# Patient Record
Sex: Male | Born: 1935
Health system: Southern US, Community
[De-identification: ages and names within clinical notes are randomized; demographics above are authoritative.]

## PROBLEM LIST (undated history)

## (undated) DIAGNOSIS — I2510 Atherosclerotic heart disease of native coronary artery without angina pectoris: Secondary | ICD-10-CM

## (undated) DIAGNOSIS — M1990 Unspecified osteoarthritis, unspecified site: Secondary | ICD-10-CM

## (undated) DIAGNOSIS — I4891 Unspecified atrial fibrillation: Secondary | ICD-10-CM

## (undated) DIAGNOSIS — I10 Essential (primary) hypertension: Secondary | ICD-10-CM

## (undated) DIAGNOSIS — M549 Dorsalgia, unspecified: Secondary | ICD-10-CM

## (undated) HISTORY — DX: Unspecified atrial fibrillation: I48.91

## (undated) HISTORY — DX: Dorsalgia, unspecified: M54.9

## (undated) HISTORY — DX: Unspecified osteoarthritis, unspecified site: M19.90

## (undated) HISTORY — DX: Atherosclerotic heart disease of native coronary artery without angina pectoris: I25.10

## (undated) HISTORY — DX: Essential (primary) hypertension: I10

---

## 2005-02-19 ENCOUNTER — Ambulatory Visit: Payer: Self-pay | Admitting: Internal Medicine

## 2005-07-09 ENCOUNTER — Ambulatory Visit: Payer: Self-pay | Admitting: Gastroenterology

## 2009-08-13 ENCOUNTER — Ambulatory Visit: Payer: Self-pay | Admitting: Internal Medicine

## 2009-08-13 ENCOUNTER — Inpatient Hospital Stay: Payer: Self-pay | Admitting: Internal Medicine

## 2011-09-29 ENCOUNTER — Ambulatory Visit: Payer: Self-pay | Admitting: Family

## 2014-02-07 DIAGNOSIS — I10 Essential (primary) hypertension: Secondary | ICD-10-CM | POA: Diagnosis not present

## 2014-02-07 DIAGNOSIS — Z7901 Long term (current) use of anticoagulants: Secondary | ICD-10-CM | POA: Diagnosis not present

## 2014-02-07 DIAGNOSIS — I208 Other forms of angina pectoris: Secondary | ICD-10-CM | POA: Diagnosis not present

## 2014-02-07 DIAGNOSIS — I4891 Unspecified atrial fibrillation: Secondary | ICD-10-CM | POA: Diagnosis not present

## 2014-03-10 DIAGNOSIS — I208 Other forms of angina pectoris: Secondary | ICD-10-CM | POA: Diagnosis not present

## 2014-03-10 DIAGNOSIS — I4891 Unspecified atrial fibrillation: Secondary | ICD-10-CM | POA: Diagnosis not present

## 2014-04-10 DIAGNOSIS — I208 Other forms of angina pectoris: Secondary | ICD-10-CM | POA: Diagnosis not present

## 2014-04-10 DIAGNOSIS — E784 Other hyperlipidemia: Secondary | ICD-10-CM | POA: Diagnosis not present

## 2014-04-10 DIAGNOSIS — I4891 Unspecified atrial fibrillation: Secondary | ICD-10-CM | POA: Diagnosis not present

## 2014-04-10 DIAGNOSIS — I10 Essential (primary) hypertension: Secondary | ICD-10-CM | POA: Diagnosis not present

## 2014-05-11 DIAGNOSIS — I10 Essential (primary) hypertension: Secondary | ICD-10-CM | POA: Diagnosis not present

## 2014-05-19 DIAGNOSIS — I119 Hypertensive heart disease without heart failure: Secondary | ICD-10-CM | POA: Diagnosis not present

## 2014-05-19 DIAGNOSIS — I4891 Unspecified atrial fibrillation: Secondary | ICD-10-CM | POA: Diagnosis not present

## 2014-05-19 DIAGNOSIS — I208 Other forms of angina pectoris: Secondary | ICD-10-CM | POA: Diagnosis not present

## 2014-05-19 DIAGNOSIS — I519 Heart disease, unspecified: Secondary | ICD-10-CM | POA: Diagnosis not present

## 2014-06-16 DIAGNOSIS — I10 Essential (primary) hypertension: Secondary | ICD-10-CM | POA: Diagnosis not present

## 2014-06-16 DIAGNOSIS — E784 Other hyperlipidemia: Secondary | ICD-10-CM | POA: Diagnosis not present

## 2014-06-16 DIAGNOSIS — I4891 Unspecified atrial fibrillation: Secondary | ICD-10-CM | POA: Diagnosis not present

## 2014-07-17 DIAGNOSIS — E784 Other hyperlipidemia: Secondary | ICD-10-CM | POA: Diagnosis not present

## 2014-07-17 DIAGNOSIS — I10 Essential (primary) hypertension: Secondary | ICD-10-CM | POA: Diagnosis not present

## 2014-07-17 DIAGNOSIS — I208 Other forms of angina pectoris: Secondary | ICD-10-CM | POA: Diagnosis not present

## 2014-07-17 DIAGNOSIS — I4891 Unspecified atrial fibrillation: Secondary | ICD-10-CM | POA: Diagnosis not present

## 2014-08-14 DIAGNOSIS — I10 Essential (primary) hypertension: Secondary | ICD-10-CM | POA: Diagnosis not present

## 2014-08-14 DIAGNOSIS — I208 Other forms of angina pectoris: Secondary | ICD-10-CM | POA: Diagnosis not present

## 2014-08-14 DIAGNOSIS — E784 Other hyperlipidemia: Secondary | ICD-10-CM | POA: Diagnosis not present

## 2014-08-14 DIAGNOSIS — I4891 Unspecified atrial fibrillation: Secondary | ICD-10-CM | POA: Diagnosis not present

## 2014-09-11 DIAGNOSIS — I4891 Unspecified atrial fibrillation: Secondary | ICD-10-CM | POA: Diagnosis not present

## 2014-09-11 DIAGNOSIS — I10 Essential (primary) hypertension: Secondary | ICD-10-CM | POA: Diagnosis not present

## 2014-09-28 DIAGNOSIS — Z125 Encounter for screening for malignant neoplasm of prostate: Secondary | ICD-10-CM | POA: Diagnosis not present

## 2014-09-28 DIAGNOSIS — I10 Essential (primary) hypertension: Secondary | ICD-10-CM | POA: Diagnosis not present

## 2014-09-28 DIAGNOSIS — R5381 Other malaise: Secondary | ICD-10-CM | POA: Diagnosis not present

## 2014-09-28 DIAGNOSIS — E784 Other hyperlipidemia: Secondary | ICD-10-CM | POA: Diagnosis not present

## 2014-10-02 DIAGNOSIS — R69 Illness, unspecified: Secondary | ICD-10-CM | POA: Diagnosis not present

## 2014-10-12 DIAGNOSIS — I208 Other forms of angina pectoris: Secondary | ICD-10-CM | POA: Diagnosis not present

## 2014-10-12 DIAGNOSIS — I10 Essential (primary) hypertension: Secondary | ICD-10-CM | POA: Diagnosis not present

## 2014-10-12 DIAGNOSIS — I4891 Unspecified atrial fibrillation: Secondary | ICD-10-CM | POA: Diagnosis not present

## 2014-11-13 DIAGNOSIS — I4891 Unspecified atrial fibrillation: Secondary | ICD-10-CM | POA: Diagnosis not present

## 2014-11-13 DIAGNOSIS — I208 Other forms of angina pectoris: Secondary | ICD-10-CM | POA: Diagnosis not present

## 2014-11-13 DIAGNOSIS — H532 Diplopia: Secondary | ICD-10-CM | POA: Diagnosis not present

## 2014-11-14 DIAGNOSIS — H5210 Myopia, unspecified eye: Secondary | ICD-10-CM | POA: Diagnosis not present

## 2014-11-14 DIAGNOSIS — H524 Presbyopia: Secondary | ICD-10-CM | POA: Diagnosis not present

## 2014-11-14 DIAGNOSIS — Z961 Presence of intraocular lens: Secondary | ICD-10-CM | POA: Diagnosis not present

## 2014-11-20 ENCOUNTER — Other Ambulatory Visit: Payer: Self-pay | Admitting: Internal Medicine

## 2014-11-20 DIAGNOSIS — I6340 Cerebral infarction due to embolism of unspecified cerebral artery: Secondary | ICD-10-CM

## 2014-11-20 DIAGNOSIS — I208 Other forms of angina pectoris: Secondary | ICD-10-CM | POA: Diagnosis not present

## 2014-11-30 ENCOUNTER — Other Ambulatory Visit: Payer: Self-pay | Admitting: Internal Medicine

## 2014-11-30 ENCOUNTER — Ambulatory Visit
Admission: RE | Admit: 2014-11-30 | Discharge: 2014-11-30 | Disposition: A | Discharge status: Home / Self Care | Payer: Commercial Managed Care - HMO | Source: Ambulatory Visit | Attending: Internal Medicine | Admitting: Internal Medicine

## 2014-11-30 DIAGNOSIS — I6340 Cerebral infarction due to embolism of unspecified cerebral artery: Secondary | ICD-10-CM | POA: Insufficient documentation

## 2014-11-30 DIAGNOSIS — J323 Chronic sphenoidal sinusitis: Secondary | ICD-10-CM | POA: Insufficient documentation

## 2014-11-30 DIAGNOSIS — G9389 Other specified disorders of brain: Secondary | ICD-10-CM | POA: Insufficient documentation

## 2014-11-30 DIAGNOSIS — H538 Other visual disturbances: Secondary | ICD-10-CM | POA: Diagnosis not present

## 2014-11-30 MED ORDER — GADOBENATE DIMEGLUMINE 529 MG/ML IV SOLN
15.0000 mL | Freq: Once | INTRAVENOUS | Status: AC | PRN
  Administered 2014-11-30: 15 mL via INTRAVENOUS

## 2014-12-19 DIAGNOSIS — I4891 Unspecified atrial fibrillation: Secondary | ICD-10-CM | POA: Diagnosis not present

## 2014-12-19 DIAGNOSIS — Q179 Congenital malformation of ear, unspecified: Secondary | ICD-10-CM | POA: Diagnosis not present

## 2015-01-09 DIAGNOSIS — L570 Actinic keratosis: Secondary | ICD-10-CM | POA: Diagnosis not present

## 2015-01-09 DIAGNOSIS — L578 Other skin changes due to chronic exposure to nonionizing radiation: Secondary | ICD-10-CM | POA: Diagnosis not present

## 2015-01-09 DIAGNOSIS — C44212 Basal cell carcinoma of skin of right ear and external auricular canal: Secondary | ICD-10-CM | POA: Diagnosis not present

## 2015-01-09 DIAGNOSIS — Z85828 Personal history of other malignant neoplasm of skin: Secondary | ICD-10-CM | POA: Diagnosis not present

## 2015-01-09 DIAGNOSIS — D485 Neoplasm of uncertain behavior of skin: Secondary | ICD-10-CM | POA: Diagnosis not present

## 2015-01-18 DIAGNOSIS — I10 Essential (primary) hypertension: Secondary | ICD-10-CM | POA: Diagnosis not present

## 2015-01-18 DIAGNOSIS — I208 Other forms of angina pectoris: Secondary | ICD-10-CM | POA: Diagnosis not present

## 2015-01-18 DIAGNOSIS — I4891 Unspecified atrial fibrillation: Secondary | ICD-10-CM | POA: Diagnosis not present

## 2015-01-18 DIAGNOSIS — E784 Other hyperlipidemia: Secondary | ICD-10-CM | POA: Diagnosis not present

## 2015-02-13 DIAGNOSIS — C44212 Basal cell carcinoma of skin of right ear and external auricular canal: Secondary | ICD-10-CM | POA: Diagnosis not present

## 2015-02-19 DIAGNOSIS — I10 Essential (primary) hypertension: Secondary | ICD-10-CM | POA: Diagnosis not present

## 2015-02-19 DIAGNOSIS — I208 Other forms of angina pectoris: Secondary | ICD-10-CM | POA: Diagnosis not present

## 2015-02-19 DIAGNOSIS — I4891 Unspecified atrial fibrillation: Secondary | ICD-10-CM | POA: Diagnosis not present

## 2015-02-19 DIAGNOSIS — E784 Other hyperlipidemia: Secondary | ICD-10-CM | POA: Diagnosis not present

## 2015-03-05 DIAGNOSIS — I4891 Unspecified atrial fibrillation: Secondary | ICD-10-CM | POA: Diagnosis not present

## 2015-03-05 DIAGNOSIS — I208 Other forms of angina pectoris: Secondary | ICD-10-CM | POA: Diagnosis not present

## 2015-03-05 DIAGNOSIS — E784 Other hyperlipidemia: Secondary | ICD-10-CM | POA: Diagnosis not present

## 2015-03-28 DIAGNOSIS — L578 Other skin changes due to chronic exposure to nonionizing radiation: Secondary | ICD-10-CM | POA: Diagnosis not present

## 2015-03-28 DIAGNOSIS — L570 Actinic keratosis: Secondary | ICD-10-CM | POA: Diagnosis not present

## 2015-03-28 DIAGNOSIS — Z85828 Personal history of other malignant neoplasm of skin: Secondary | ICD-10-CM | POA: Diagnosis not present

## 2015-04-02 DIAGNOSIS — I4891 Unspecified atrial fibrillation: Secondary | ICD-10-CM | POA: Diagnosis not present

## 2015-04-02 DIAGNOSIS — E784 Other hyperlipidemia: Secondary | ICD-10-CM | POA: Diagnosis not present

## 2015-04-02 DIAGNOSIS — I10 Essential (primary) hypertension: Secondary | ICD-10-CM | POA: Diagnosis not present

## 2015-05-03 DIAGNOSIS — I4891 Unspecified atrial fibrillation: Secondary | ICD-10-CM | POA: Diagnosis not present

## 2015-05-03 DIAGNOSIS — I208 Other forms of angina pectoris: Secondary | ICD-10-CM | POA: Diagnosis not present

## 2015-05-03 DIAGNOSIS — I10 Essential (primary) hypertension: Secondary | ICD-10-CM | POA: Diagnosis not present

## 2015-05-24 DIAGNOSIS — D485 Neoplasm of uncertain behavior of skin: Secondary | ICD-10-CM | POA: Diagnosis not present

## 2015-05-24 DIAGNOSIS — C44519 Basal cell carcinoma of skin of other part of trunk: Secondary | ICD-10-CM | POA: Diagnosis not present

## 2015-05-24 DIAGNOSIS — D1800 Hemangioma unspecified site: Secondary | ICD-10-CM | POA: Diagnosis not present

## 2015-05-24 DIAGNOSIS — L821 Other seborrheic keratosis: Secondary | ICD-10-CM | POA: Diagnosis not present

## 2015-05-24 DIAGNOSIS — Z85828 Personal history of other malignant neoplasm of skin: Secondary | ICD-10-CM | POA: Diagnosis not present

## 2015-05-24 DIAGNOSIS — L570 Actinic keratosis: Secondary | ICD-10-CM | POA: Diagnosis not present

## 2015-06-01 DIAGNOSIS — I10 Essential (primary) hypertension: Secondary | ICD-10-CM | POA: Diagnosis not present

## 2015-06-01 DIAGNOSIS — E784 Other hyperlipidemia: Secondary | ICD-10-CM | POA: Diagnosis not present

## 2015-06-01 DIAGNOSIS — I4891 Unspecified atrial fibrillation: Secondary | ICD-10-CM | POA: Diagnosis not present

## 2015-07-02 DIAGNOSIS — I208 Other forms of angina pectoris: Secondary | ICD-10-CM | POA: Diagnosis not present

## 2015-07-02 DIAGNOSIS — E784 Other hyperlipidemia: Secondary | ICD-10-CM | POA: Diagnosis not present

## 2015-07-02 DIAGNOSIS — I4891 Unspecified atrial fibrillation: Secondary | ICD-10-CM | POA: Diagnosis not present

## 2015-07-02 DIAGNOSIS — I10 Essential (primary) hypertension: Secondary | ICD-10-CM | POA: Diagnosis not present

## 2015-07-09 ENCOUNTER — Other Ambulatory Visit: Payer: Self-pay | Admitting: Internal Medicine

## 2015-07-09 ENCOUNTER — Ambulatory Visit
Admission: RE | Admit: 2015-07-09 | Discharge: 2015-07-09 | Disposition: A | Discharge status: Home / Self Care | Payer: Commercial Managed Care - HMO | Source: Ambulatory Visit | Attending: Internal Medicine | Admitting: Internal Medicine

## 2015-07-09 DIAGNOSIS — I6782 Cerebral ischemia: Secondary | ICD-10-CM | POA: Insufficient documentation

## 2015-07-09 DIAGNOSIS — J323 Chronic sphenoidal sinusitis: Secondary | ICD-10-CM | POA: Insufficient documentation

## 2015-07-09 DIAGNOSIS — R55 Syncope and collapse: Secondary | ICD-10-CM | POA: Diagnosis not present

## 2015-07-09 DIAGNOSIS — R5381 Other malaise: Secondary | ICD-10-CM | POA: Diagnosis not present

## 2015-07-09 DIAGNOSIS — E784 Other hyperlipidemia: Secondary | ICD-10-CM | POA: Diagnosis not present

## 2015-07-09 DIAGNOSIS — T671XXD Heat syncope, subsequent encounter: Secondary | ICD-10-CM

## 2015-07-09 DIAGNOSIS — I4891 Unspecified atrial fibrillation: Secondary | ICD-10-CM | POA: Diagnosis not present

## 2015-07-09 DIAGNOSIS — G9389 Other specified disorders of brain: Secondary | ICD-10-CM | POA: Insufficient documentation

## 2015-07-09 DIAGNOSIS — I10 Essential (primary) hypertension: Secondary | ICD-10-CM | POA: Diagnosis not present

## 2015-07-10 DIAGNOSIS — I4891 Unspecified atrial fibrillation: Secondary | ICD-10-CM | POA: Diagnosis not present

## 2015-07-10 DIAGNOSIS — I208 Other forms of angina pectoris: Secondary | ICD-10-CM | POA: Diagnosis not present

## 2015-07-10 DIAGNOSIS — I10 Essential (primary) hypertension: Secondary | ICD-10-CM | POA: Diagnosis not present

## 2015-07-10 DIAGNOSIS — E784 Other hyperlipidemia: Secondary | ICD-10-CM | POA: Diagnosis not present

## 2015-08-01 DIAGNOSIS — I4891 Unspecified atrial fibrillation: Secondary | ICD-10-CM | POA: Diagnosis not present

## 2015-08-01 DIAGNOSIS — I208 Other forms of angina pectoris: Secondary | ICD-10-CM | POA: Diagnosis not present

## 2015-08-01 DIAGNOSIS — I10 Essential (primary) hypertension: Secondary | ICD-10-CM | POA: Diagnosis not present

## 2015-08-01 DIAGNOSIS — E784 Other hyperlipidemia: Secondary | ICD-10-CM | POA: Diagnosis not present

## 2015-08-29 DIAGNOSIS — I4891 Unspecified atrial fibrillation: Secondary | ICD-10-CM | POA: Diagnosis not present

## 2015-08-29 DIAGNOSIS — E784 Other hyperlipidemia: Secondary | ICD-10-CM | POA: Diagnosis not present

## 2015-08-29 DIAGNOSIS — I208 Other forms of angina pectoris: Secondary | ICD-10-CM | POA: Diagnosis not present

## 2015-08-29 DIAGNOSIS — I10 Essential (primary) hypertension: Secondary | ICD-10-CM | POA: Diagnosis not present

## 2015-09-27 DIAGNOSIS — I208 Other forms of angina pectoris: Secondary | ICD-10-CM | POA: Diagnosis not present

## 2015-09-27 DIAGNOSIS — I4891 Unspecified atrial fibrillation: Secondary | ICD-10-CM | POA: Diagnosis not present

## 2015-09-27 DIAGNOSIS — E784 Other hyperlipidemia: Secondary | ICD-10-CM | POA: Diagnosis not present

## 2015-10-08 DIAGNOSIS — I208 Other forms of angina pectoris: Secondary | ICD-10-CM | POA: Diagnosis not present

## 2015-10-08 DIAGNOSIS — I4891 Unspecified atrial fibrillation: Secondary | ICD-10-CM | POA: Diagnosis not present

## 2015-10-08 DIAGNOSIS — R079 Chest pain, unspecified: Secondary | ICD-10-CM | POA: Diagnosis not present

## 2015-10-09 DIAGNOSIS — I4891 Unspecified atrial fibrillation: Secondary | ICD-10-CM | POA: Diagnosis not present

## 2015-10-09 DIAGNOSIS — R079 Chest pain, unspecified: Secondary | ICD-10-CM | POA: Diagnosis not present

## 2015-10-09 DIAGNOSIS — I208 Other forms of angina pectoris: Secondary | ICD-10-CM | POA: Diagnosis not present

## 2015-11-01 DIAGNOSIS — I208 Other forms of angina pectoris: Secondary | ICD-10-CM | POA: Diagnosis not present

## 2015-11-01 DIAGNOSIS — I10 Essential (primary) hypertension: Secondary | ICD-10-CM | POA: Diagnosis not present

## 2015-11-01 DIAGNOSIS — I4891 Unspecified atrial fibrillation: Secondary | ICD-10-CM | POA: Diagnosis not present

## 2015-11-01 DIAGNOSIS — Z23 Encounter for immunization: Secondary | ICD-10-CM | POA: Diagnosis not present

## 2015-11-01 DIAGNOSIS — E784 Other hyperlipidemia: Secondary | ICD-10-CM | POA: Diagnosis not present

## 2015-11-30 DIAGNOSIS — I208 Other forms of angina pectoris: Secondary | ICD-10-CM | POA: Diagnosis not present

## 2015-11-30 DIAGNOSIS — I10 Essential (primary) hypertension: Secondary | ICD-10-CM | POA: Diagnosis not present

## 2015-11-30 DIAGNOSIS — I4891 Unspecified atrial fibrillation: Secondary | ICD-10-CM | POA: Diagnosis not present

## 2015-11-30 DIAGNOSIS — E784 Other hyperlipidemia: Secondary | ICD-10-CM | POA: Diagnosis not present

## 2015-12-28 DIAGNOSIS — I208 Other forms of angina pectoris: Secondary | ICD-10-CM | POA: Diagnosis not present

## 2015-12-28 DIAGNOSIS — I4891 Unspecified atrial fibrillation: Secondary | ICD-10-CM | POA: Diagnosis not present

## 2015-12-28 DIAGNOSIS — E784 Other hyperlipidemia: Secondary | ICD-10-CM | POA: Diagnosis not present

## 2015-12-28 DIAGNOSIS — I10 Essential (primary) hypertension: Secondary | ICD-10-CM | POA: Diagnosis not present

## 2016-01-28 DIAGNOSIS — E784 Other hyperlipidemia: Secondary | ICD-10-CM | POA: Diagnosis not present

## 2016-01-28 DIAGNOSIS — J208 Acute bronchitis due to other specified organisms: Secondary | ICD-10-CM | POA: Diagnosis not present

## 2016-01-28 DIAGNOSIS — I10 Essential (primary) hypertension: Secondary | ICD-10-CM | POA: Diagnosis not present

## 2016-01-28 DIAGNOSIS — I4891 Unspecified atrial fibrillation: Secondary | ICD-10-CM | POA: Diagnosis not present

## 2016-02-26 DIAGNOSIS — I4891 Unspecified atrial fibrillation: Secondary | ICD-10-CM | POA: Diagnosis not present

## 2016-02-26 DIAGNOSIS — I208 Other forms of angina pectoris: Secondary | ICD-10-CM | POA: Diagnosis not present

## 2016-02-26 DIAGNOSIS — I10 Essential (primary) hypertension: Secondary | ICD-10-CM | POA: Diagnosis not present

## 2016-02-26 DIAGNOSIS — E784 Other hyperlipidemia: Secondary | ICD-10-CM | POA: Diagnosis not present

## 2016-03-25 DIAGNOSIS — I208 Other forms of angina pectoris: Secondary | ICD-10-CM | POA: Diagnosis not present

## 2016-03-25 DIAGNOSIS — E784 Other hyperlipidemia: Secondary | ICD-10-CM | POA: Diagnosis not present

## 2016-03-25 DIAGNOSIS — I4891 Unspecified atrial fibrillation: Secondary | ICD-10-CM | POA: Diagnosis not present

## 2016-03-25 DIAGNOSIS — I10 Essential (primary) hypertension: Secondary | ICD-10-CM | POA: Diagnosis not present

## 2016-04-22 DIAGNOSIS — I208 Other forms of angina pectoris: Secondary | ICD-10-CM | POA: Diagnosis not present

## 2016-04-22 DIAGNOSIS — I4891 Unspecified atrial fibrillation: Secondary | ICD-10-CM | POA: Diagnosis not present

## 2016-04-22 DIAGNOSIS — I10 Essential (primary) hypertension: Secondary | ICD-10-CM | POA: Diagnosis not present

## 2016-04-22 DIAGNOSIS — E784 Other hyperlipidemia: Secondary | ICD-10-CM | POA: Diagnosis not present

## 2016-05-23 DIAGNOSIS — I4891 Unspecified atrial fibrillation: Secondary | ICD-10-CM | POA: Diagnosis not present

## 2016-05-23 DIAGNOSIS — I10 Essential (primary) hypertension: Secondary | ICD-10-CM | POA: Diagnosis not present

## 2016-05-23 DIAGNOSIS — E784 Other hyperlipidemia: Secondary | ICD-10-CM | POA: Diagnosis not present

## 2016-05-23 DIAGNOSIS — I208 Other forms of angina pectoris: Secondary | ICD-10-CM | POA: Diagnosis not present

## 2016-06-20 DIAGNOSIS — I208 Other forms of angina pectoris: Secondary | ICD-10-CM | POA: Diagnosis not present

## 2016-06-20 DIAGNOSIS — Z125 Encounter for screening for malignant neoplasm of prostate: Secondary | ICD-10-CM | POA: Diagnosis not present

## 2016-06-20 DIAGNOSIS — I4891 Unspecified atrial fibrillation: Secondary | ICD-10-CM | POA: Diagnosis not present

## 2016-06-20 DIAGNOSIS — R5381 Other malaise: Secondary | ICD-10-CM | POA: Diagnosis not present

## 2016-06-20 DIAGNOSIS — I10 Essential (primary) hypertension: Secondary | ICD-10-CM | POA: Diagnosis not present

## 2016-06-20 DIAGNOSIS — E784 Other hyperlipidemia: Secondary | ICD-10-CM | POA: Diagnosis not present

## 2016-07-28 DIAGNOSIS — I4891 Unspecified atrial fibrillation: Secondary | ICD-10-CM | POA: Diagnosis not present

## 2016-07-28 DIAGNOSIS — I208 Other forms of angina pectoris: Secondary | ICD-10-CM | POA: Diagnosis not present

## 2016-07-28 DIAGNOSIS — E784 Other hyperlipidemia: Secondary | ICD-10-CM | POA: Diagnosis not present

## 2016-07-28 DIAGNOSIS — I10 Essential (primary) hypertension: Secondary | ICD-10-CM | POA: Diagnosis not present

## 2016-08-25 DIAGNOSIS — I10 Essential (primary) hypertension: Secondary | ICD-10-CM | POA: Diagnosis not present

## 2016-08-25 DIAGNOSIS — I208 Other forms of angina pectoris: Secondary | ICD-10-CM | POA: Diagnosis not present

## 2016-08-25 DIAGNOSIS — I4891 Unspecified atrial fibrillation: Secondary | ICD-10-CM | POA: Diagnosis not present

## 2016-08-25 DIAGNOSIS — E784 Other hyperlipidemia: Secondary | ICD-10-CM | POA: Diagnosis not present

## 2016-08-26 IMAGING — CT CT HEAD W/O CM
3 series · 15 of 47 positions shown, 18 images · non-contrast
Comparison: MRI from 11/30/2014.

CLINICAL DATA: Syncope.

EXAM:
CT HEAD WITHOUT CONTRAST
TECHNIQUE: Contiguous axial images were obtained from the base of the skull
through the vertex without intravenous contrast.

[Series 2: head wo · axial · 0.42mm/px · z∈[-176,-51]mm · 9 of 31 slices shown, 12 images]
[im 3/31  brain]
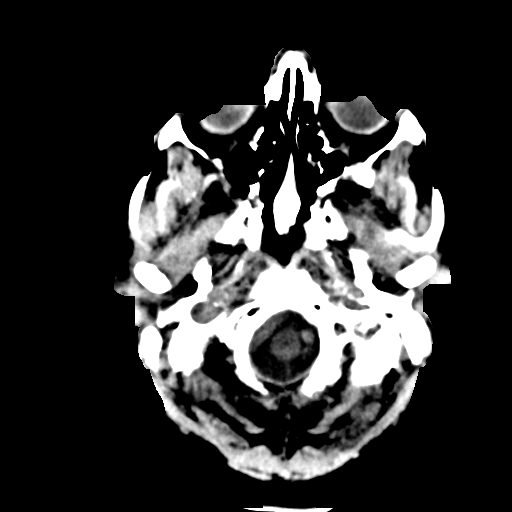
[im 3/31  bone]
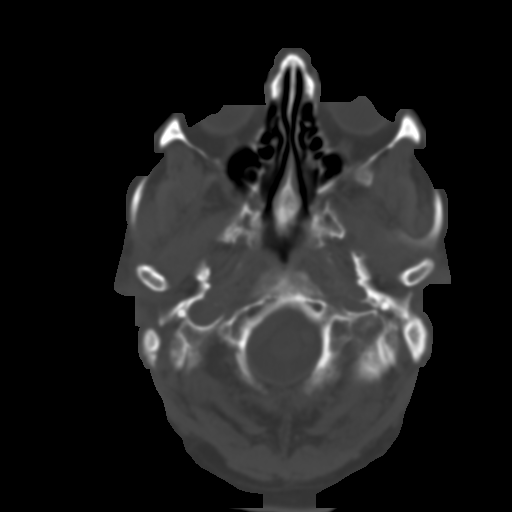
[im 6/31  brain]
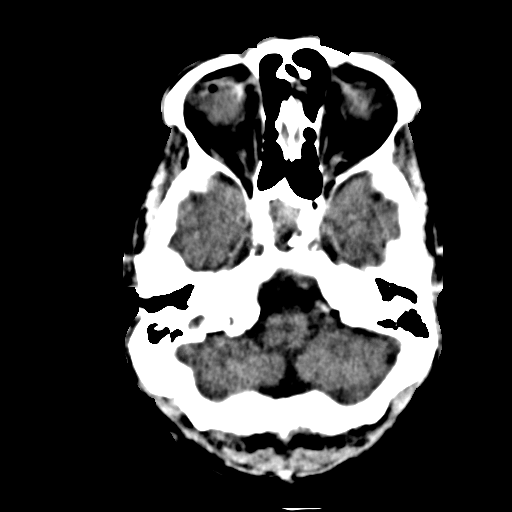
[im 9/31  brain]
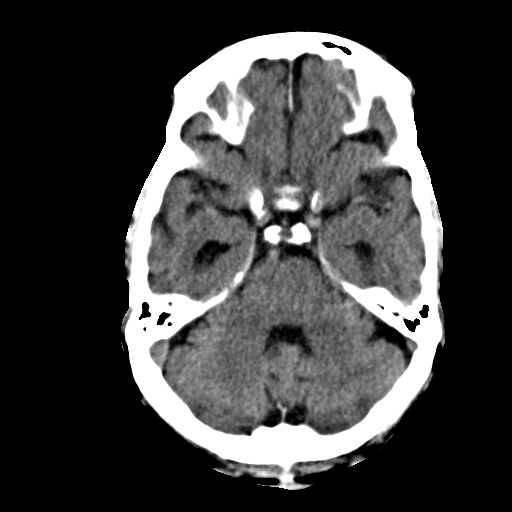
[im 12/31  brain]
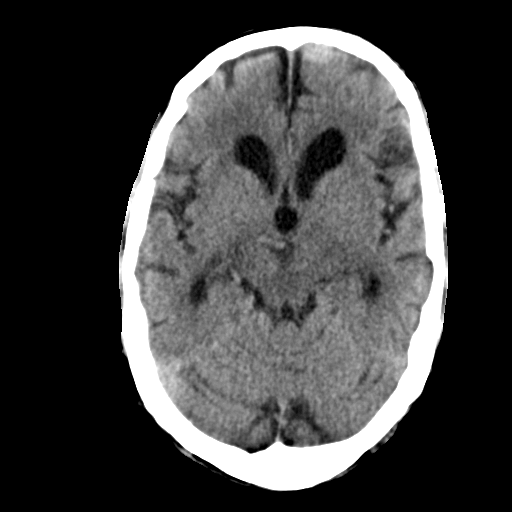
[im 16/31  brain]
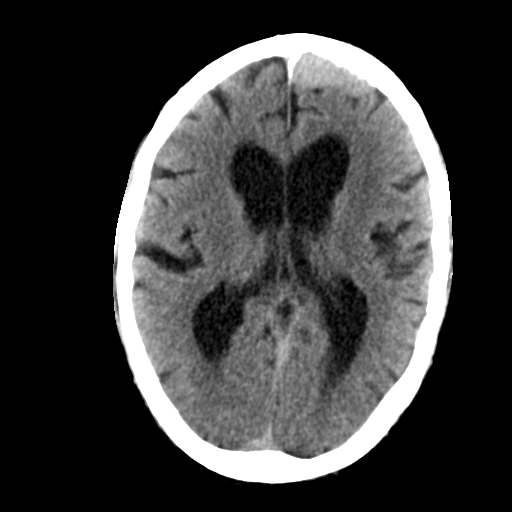
[im 16/31  bone]
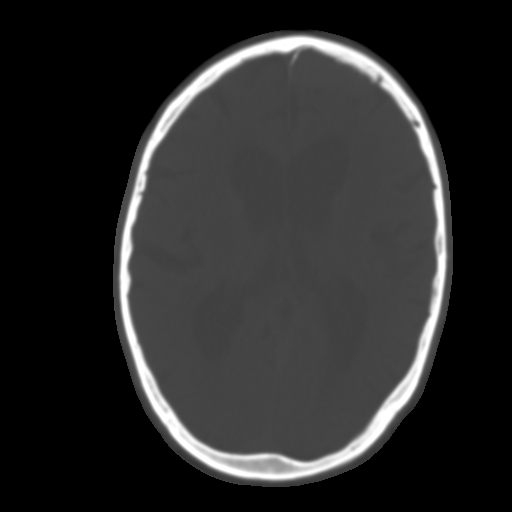
[im 19/31  brain]
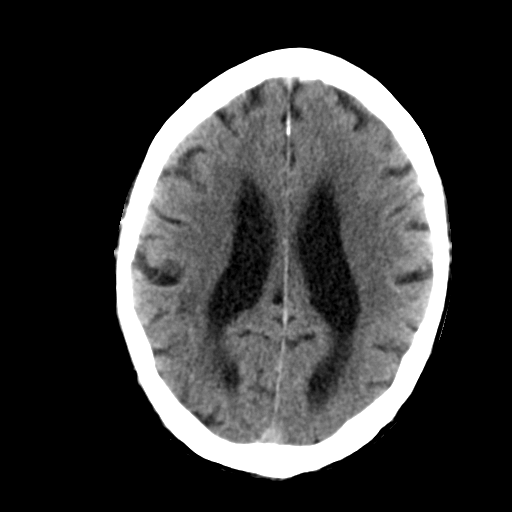
[im 22/31  brain]
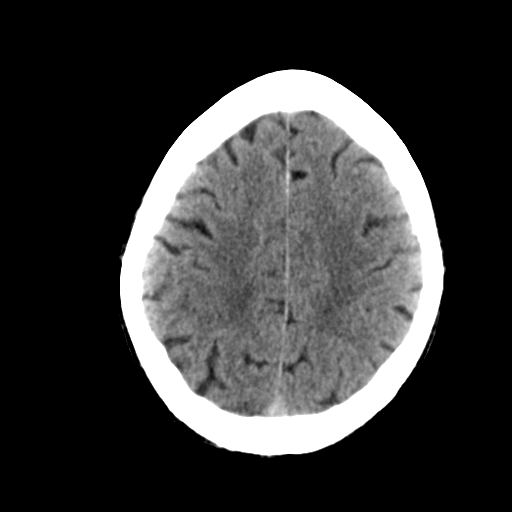
[im 25/31  brain]
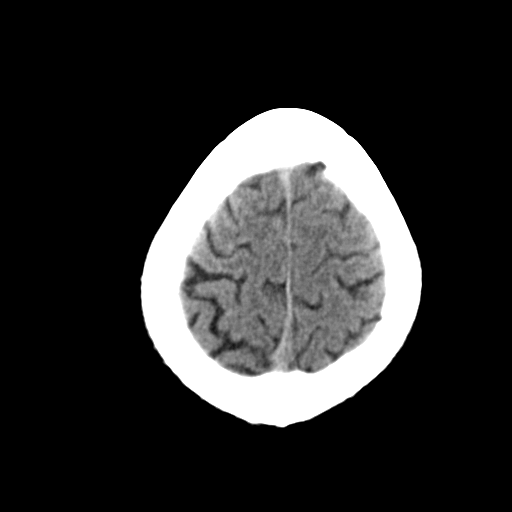
[im 28/31  brain]
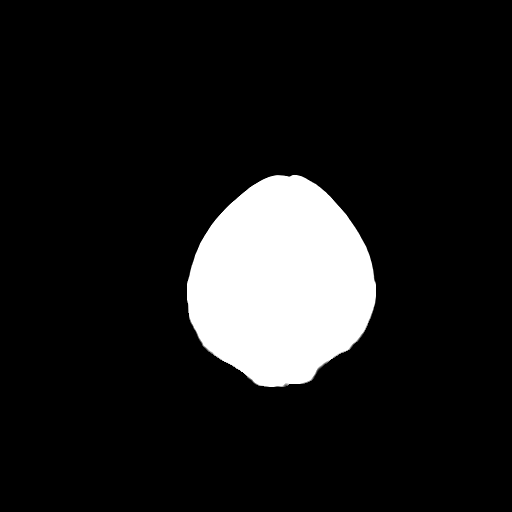
[im 28/31  bone]
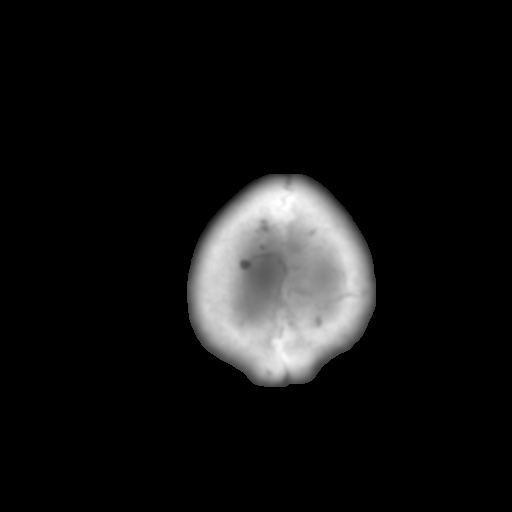

[Series 4: coronal soft · coronal · 0.32mm/px · 3 of 68 slices shown]
[im 23/68  brain]
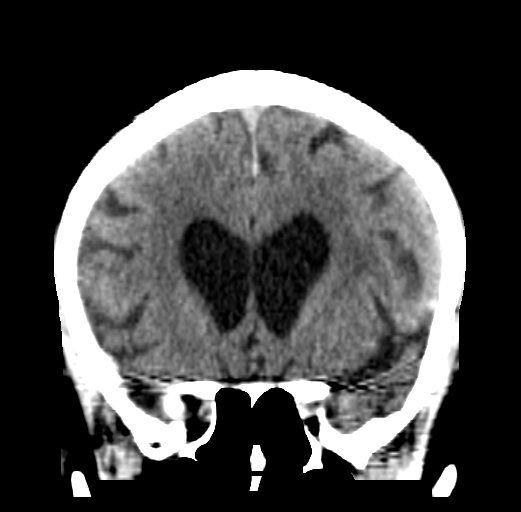
[im 30/68  brain]
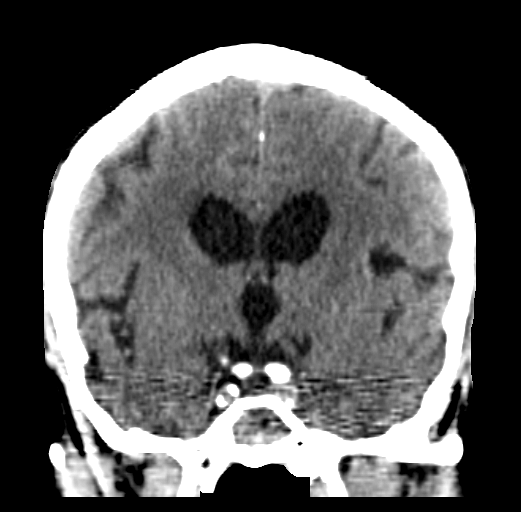
[im 38/68  brain]
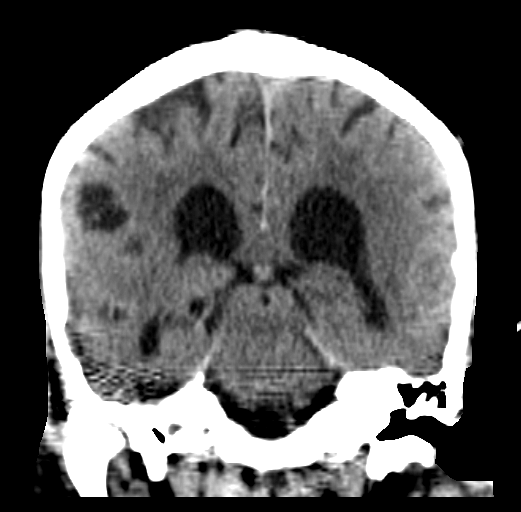

[Series 5: sagittal soft · sagittal · 0.31mm/px · 3 of 53 slices shown]
[im 18/53  brain]
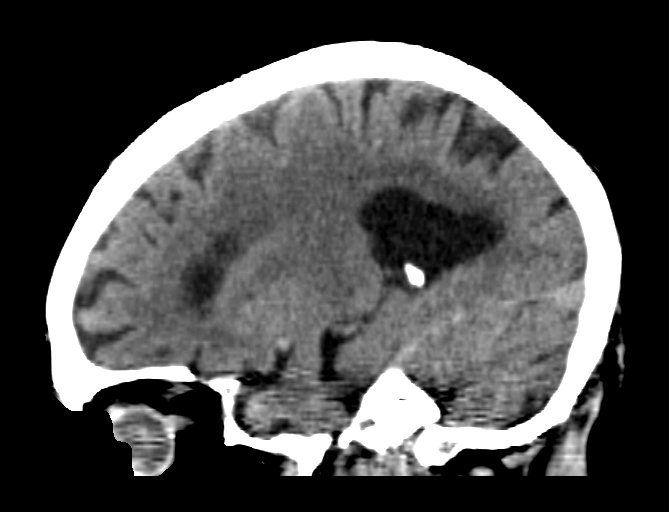
[im 27/53  brain]
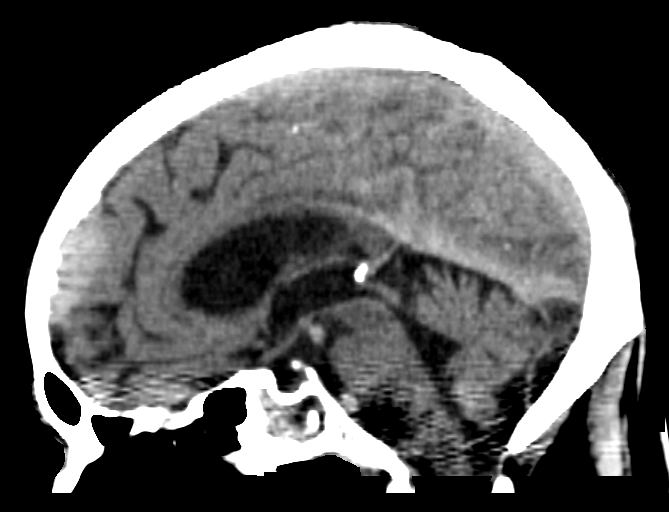
[im 35/53  brain]
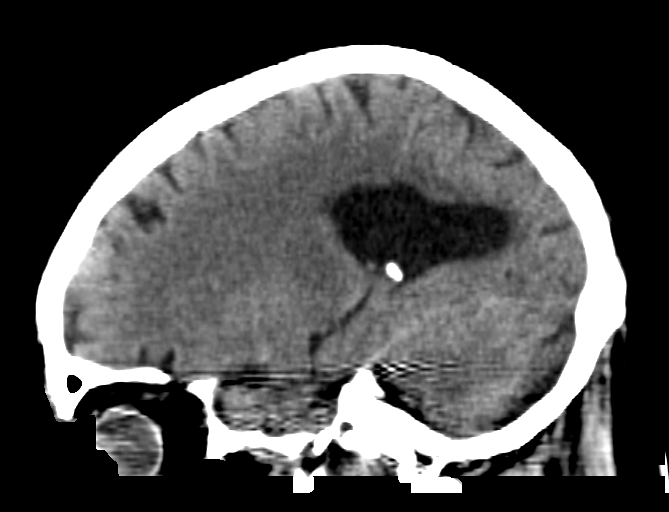

[15 of 47 positions shown; findings below may reference images not displayed]

FINDINGS: 3.4 x 1.5 cm left frontal extra-axial mass lesion is again
identified, not substantially changed in the interval since the
prior MRI. No evidence for acute hemorrhage, hydrocephalus, or
midline shift. No CT evidence for acute infarction. Diffuse loss of
parenchymal volume is consistent with atrophy. Patchy low
attenuation in the deep hemispheric and periventricular white matter
is nonspecific, but likely reflects chronic microvascular ischemic
demyelination.

Chronic opacification of the right sphenoid sinus again noted. The
remaining visualized paranasal sinuses and mastoid air cells are
clear. No evidence for skull fracture.
IMPRESSION: 1. No new or acute interval findings.
2. 3.4 x 1.5 cm left frontal extra-axial mass lesion better
characterized on previous MRI as likely related to meningioma or
intracranial dermoid. No evidence for associated edema on today's
study.
3. Stable chronic opacification of the right sphenoid sinus.
4. Atrophy with chronic small vessel white matter ischemic disease.

## 2016-09-26 DIAGNOSIS — I208 Other forms of angina pectoris: Secondary | ICD-10-CM | POA: Diagnosis not present

## 2016-09-26 DIAGNOSIS — I10 Essential (primary) hypertension: Secondary | ICD-10-CM | POA: Diagnosis not present

## 2016-09-26 DIAGNOSIS — I4891 Unspecified atrial fibrillation: Secondary | ICD-10-CM | POA: Diagnosis not present

## 2016-09-26 DIAGNOSIS — E784 Other hyperlipidemia: Secondary | ICD-10-CM | POA: Diagnosis not present

## 2016-12-15 DIAGNOSIS — Z961 Presence of intraocular lens: Secondary | ICD-10-CM | POA: Diagnosis not present

## 2016-12-15 DIAGNOSIS — H524 Presbyopia: Secondary | ICD-10-CM | POA: Diagnosis not present

## 2017-02-05 DIAGNOSIS — I4891 Unspecified atrial fibrillation: Secondary | ICD-10-CM | POA: Diagnosis not present

## 2017-02-05 DIAGNOSIS — R21 Rash and other nonspecific skin eruption: Secondary | ICD-10-CM | POA: Diagnosis not present

## 2017-02-05 DIAGNOSIS — I10 Essential (primary) hypertension: Secondary | ICD-10-CM | POA: Diagnosis not present

## 2017-02-05 DIAGNOSIS — I208 Other forms of angina pectoris: Secondary | ICD-10-CM | POA: Diagnosis not present

## 2017-03-09 DIAGNOSIS — I208 Other forms of angina pectoris: Secondary | ICD-10-CM | POA: Diagnosis not present

## 2017-03-09 DIAGNOSIS — E785 Hyperlipidemia, unspecified: Secondary | ICD-10-CM | POA: Diagnosis not present

## 2017-03-09 DIAGNOSIS — I4891 Unspecified atrial fibrillation: Secondary | ICD-10-CM | POA: Diagnosis not present

## 2017-03-09 DIAGNOSIS — I10 Essential (primary) hypertension: Secondary | ICD-10-CM | POA: Diagnosis not present

## 2017-03-17 DIAGNOSIS — I4891 Unspecified atrial fibrillation: Secondary | ICD-10-CM | POA: Diagnosis not present

## 2017-03-17 DIAGNOSIS — E785 Hyperlipidemia, unspecified: Secondary | ICD-10-CM | POA: Diagnosis not present

## 2017-03-17 DIAGNOSIS — R0789 Other chest pain: Secondary | ICD-10-CM | POA: Diagnosis not present

## 2017-03-17 DIAGNOSIS — I10 Essential (primary) hypertension: Secondary | ICD-10-CM | POA: Diagnosis not present

## 2017-04-14 DIAGNOSIS — I208 Other forms of angina pectoris: Secondary | ICD-10-CM | POA: Diagnosis not present

## 2017-04-14 DIAGNOSIS — I10 Essential (primary) hypertension: Secondary | ICD-10-CM | POA: Diagnosis not present

## 2017-04-14 DIAGNOSIS — I4891 Unspecified atrial fibrillation: Secondary | ICD-10-CM | POA: Diagnosis not present

## 2017-04-14 DIAGNOSIS — E785 Hyperlipidemia, unspecified: Secondary | ICD-10-CM | POA: Diagnosis not present

## 2017-05-11 DIAGNOSIS — E785 Hyperlipidemia, unspecified: Secondary | ICD-10-CM | POA: Diagnosis not present

## 2017-05-11 DIAGNOSIS — I10 Essential (primary) hypertension: Secondary | ICD-10-CM | POA: Diagnosis not present

## 2017-05-11 DIAGNOSIS — I208 Other forms of angina pectoris: Secondary | ICD-10-CM | POA: Diagnosis not present

## 2017-05-11 DIAGNOSIS — I4891 Unspecified atrial fibrillation: Secondary | ICD-10-CM | POA: Diagnosis not present

## 2017-05-11 DIAGNOSIS — R5381 Other malaise: Secondary | ICD-10-CM | POA: Diagnosis not present

## 2017-05-11 DIAGNOSIS — R0789 Other chest pain: Secondary | ICD-10-CM | POA: Diagnosis not present

## 2017-06-08 DIAGNOSIS — R0789 Other chest pain: Secondary | ICD-10-CM | POA: Diagnosis not present

## 2017-06-08 DIAGNOSIS — E785 Hyperlipidemia, unspecified: Secondary | ICD-10-CM | POA: Diagnosis not present

## 2017-06-08 DIAGNOSIS — I4891 Unspecified atrial fibrillation: Secondary | ICD-10-CM | POA: Diagnosis not present

## 2017-06-08 DIAGNOSIS — I10 Essential (primary) hypertension: Secondary | ICD-10-CM | POA: Diagnosis not present

## 2017-06-08 DIAGNOSIS — I208 Other forms of angina pectoris: Secondary | ICD-10-CM | POA: Diagnosis not present

## 2017-07-09 DIAGNOSIS — I208 Other forms of angina pectoris: Secondary | ICD-10-CM | POA: Diagnosis not present

## 2017-07-09 DIAGNOSIS — I4891 Unspecified atrial fibrillation: Secondary | ICD-10-CM | POA: Diagnosis not present

## 2017-07-09 DIAGNOSIS — I10 Essential (primary) hypertension: Secondary | ICD-10-CM | POA: Diagnosis not present

## 2017-07-09 DIAGNOSIS — E785 Hyperlipidemia, unspecified: Secondary | ICD-10-CM | POA: Diagnosis not present

## 2017-08-07 DIAGNOSIS — E785 Hyperlipidemia, unspecified: Secondary | ICD-10-CM | POA: Diagnosis not present

## 2017-08-07 DIAGNOSIS — I10 Essential (primary) hypertension: Secondary | ICD-10-CM | POA: Diagnosis not present

## 2017-08-07 DIAGNOSIS — I4891 Unspecified atrial fibrillation: Secondary | ICD-10-CM | POA: Diagnosis not present

## 2017-08-07 DIAGNOSIS — I208 Other forms of angina pectoris: Secondary | ICD-10-CM | POA: Diagnosis not present

## 2017-09-07 DIAGNOSIS — I4891 Unspecified atrial fibrillation: Secondary | ICD-10-CM | POA: Diagnosis not present

## 2017-09-07 DIAGNOSIS — I10 Essential (primary) hypertension: Secondary | ICD-10-CM | POA: Diagnosis not present

## 2017-09-07 DIAGNOSIS — E785 Hyperlipidemia, unspecified: Secondary | ICD-10-CM | POA: Diagnosis not present

## 2017-09-07 DIAGNOSIS — I208 Other forms of angina pectoris: Secondary | ICD-10-CM | POA: Diagnosis not present

## 2017-10-08 DIAGNOSIS — N400 Enlarged prostate without lower urinary tract symptoms: Secondary | ICD-10-CM | POA: Diagnosis not present

## 2017-10-08 DIAGNOSIS — Z0000 Encounter for general adult medical examination without abnormal findings: Secondary | ICD-10-CM | POA: Diagnosis not present

## 2017-10-08 DIAGNOSIS — I350 Nonrheumatic aortic (valve) stenosis: Secondary | ICD-10-CM | POA: Diagnosis not present

## 2017-10-08 DIAGNOSIS — I119 Hypertensive heart disease without heart failure: Secondary | ICD-10-CM | POA: Diagnosis not present

## 2017-10-08 DIAGNOSIS — I4891 Unspecified atrial fibrillation: Secondary | ICD-10-CM | POA: Diagnosis not present

## 2017-10-12 DIAGNOSIS — I4891 Unspecified atrial fibrillation: Secondary | ICD-10-CM | POA: Diagnosis not present

## 2017-10-12 DIAGNOSIS — N400 Enlarged prostate without lower urinary tract symptoms: Secondary | ICD-10-CM | POA: Diagnosis not present

## 2017-10-12 DIAGNOSIS — N423 Unspecified dysplasia of prostate: Secondary | ICD-10-CM | POA: Diagnosis not present

## 2017-10-12 DIAGNOSIS — I350 Nonrheumatic aortic (valve) stenosis: Secondary | ICD-10-CM | POA: Diagnosis not present

## 2017-10-12 DIAGNOSIS — I119 Hypertensive heart disease without heart failure: Secondary | ICD-10-CM | POA: Diagnosis not present

## 2017-11-09 DIAGNOSIS — N400 Enlarged prostate without lower urinary tract symptoms: Secondary | ICD-10-CM | POA: Diagnosis not present

## 2017-11-09 DIAGNOSIS — I4891 Unspecified atrial fibrillation: Secondary | ICD-10-CM | POA: Diagnosis not present

## 2017-11-09 DIAGNOSIS — I10 Essential (primary) hypertension: Secondary | ICD-10-CM | POA: Diagnosis not present

## 2017-11-09 DIAGNOSIS — I509 Heart failure, unspecified: Secondary | ICD-10-CM | POA: Diagnosis not present

## 2017-11-09 DIAGNOSIS — Z23 Encounter for immunization: Secondary | ICD-10-CM | POA: Diagnosis not present

## 2017-11-13 ENCOUNTER — Ambulatory Visit (INDEPENDENT_AMBULATORY_CARE_PROVIDER_SITE_OTHER): Payer: Medicare HMO | Admitting: Urology

## 2017-11-13 ENCOUNTER — Encounter: Payer: Self-pay | Admitting: Urology

## 2017-11-13 VITALS — BP 135/75 | HR 59

## 2017-11-13 DIAGNOSIS — N3943 Post-void dribbling: Secondary | ICD-10-CM

## 2017-11-13 DIAGNOSIS — N401 Enlarged prostate with lower urinary tract symptoms: Secondary | ICD-10-CM

## 2017-11-13 DIAGNOSIS — N402 Nodular prostate without lower urinary tract symptoms: Secondary | ICD-10-CM | POA: Diagnosis not present

## 2017-11-13 DIAGNOSIS — N400 Enlarged prostate without lower urinary tract symptoms: Secondary | ICD-10-CM

## 2017-11-13 LAB — BLADDER SCAN AMB NON-IMAGING

## 2017-11-13 NOTE — Progress Notes (Signed)
11/13/2017 10:55 AM   Jonathon Sutton Dec 05, 1926 [id number]  Referring provider: Cletis Athens, MD [address] [place], [place] [address]  Chief Complaint  Patient presents with  . Benign Prostatic Hypertrophy    New Patient    HPI: 84 year old male referred for further evaluation of BPH/abnormal rectal exam.  Interviewing the patient's primary care notes, Dr. Lavera Guise, the patient had a rectal exam as part of her routine visit on 10/08/2017.  His prostate was described as nontender and nodular, nodule at the right lobe.  As result of this abnormal rectal exam, he is referred for further urologic evaluation.  Patient did have a PSA on 06/2016 with a value of 8.6.  Patient does not understand why he is been referred to urology.  He reports that today that he has few voiding symptoms.  He occasionally has some post void dribbling and occasionally has difficulty starting a stream but ultimately is able to void.  No straining with urination.  He gets up one time at night to void.  No urgency or frequency.  No dysuria or gross hematuria.  He is overall satisfied with his voiding.  He is not currently taking any BPH meds.  He denies any weight loss.  No bone pain but does have arthritis which causes occasional aches and pains.    He does have multiple medical comorbidities including extensive cardiac history, CKD, hypertension, DVT, h/o stroke.    PVR minimal.    PMH: Past Medical History:  Diagnosis Date  . Arthritis   . Atrial fibrillation ([place])   . Back pain   . CAD (coronary artery disease)   . Hypertension     Surgical History: History reviewed. No pertinent surgical history.  Home Medications:  Allergies as of 11/13/2017   No Known Allergies     Medication List        Accurate as of 11/13/17 10:55 AM. Always use your most recent med list.          atenolol 25 MG tablet Commonly known as:  TENORMIN   isosorbide mononitrate 20 MG tablet Commonly known as:   ISMO,MONOKET   loratadine 10 MG tablet Commonly known as:  CLARITIN   lovastatin 20 MG tablet Commonly known as:  MEVACOR   triamterene-hydrochlorothiazide 37.5-25 MG tablet Commonly known as:  MAXZIDE-25   warfarin 2.5 MG tablet Commonly known as:  COUMADIN       Allergies: No Known Allergies  Family History: History reviewed. No pertinent family history.  Social History:  reports that he has never smoked. He has never used smokeless tobacco. He reports that he does not drink alcohol or use drugs.  ROS: UROLOGY Frequent Urination?: Yes Hard to postpone urination?: Yes Burning/pain with urination?: No Get up at night to urinate?: Yes Leakage of urine?: Yes Urine stream starts and stops?: Yes Trouble starting stream?: No Do you have to strain to urinate?: No Blood in urine?: No Urinary tract infection?: No Sexually transmitted disease?: No Injury to kidneys or bladder?: No Painful intercourse?: No Weak stream?: Yes Erection problems?: No Penile pain?: No  Gastrointestinal Nausea?: No Vomiting?: No Indigestion/heartburn?: No Diarrhea?: No Constipation?: No  Constitutional Fever: No Night sweats?: No Weight loss?: No Fatigue?: No  Skin Skin rash/lesions?: Yes Itching?: Yes  Eyes Blurred vision?: No Double vision?: No  Ears/Nose/Throat Sore throat?: No Sinus problems?: No  Hematologic/Lymphatic Swollen glands?: No Easy bruising?: No  Cardiovascular Leg swelling?: No Chest pain?: No  Respiratory Cough?: Yes Shortness of breath?:  No  Endocrine Excessive thirst?: No  Musculoskeletal Back pain?: Yes Joint pain?: Yes  Neurological Headaches?: No Dizziness?: No  Psychologic Depression?: No Anxiety?: No  Physical Exam: BP 135/75   Pulse (!) 59   Constitutional:  Alert and oriented, No acute distress.  Elderly, frail. HEENT: [place] AT, moist mucus membranes.  Trachea midline, no masses. Cardiovascular: No clubbing, cyanosis, or  edema. Respiratory: Normal respiratory effort, no increased work of breathing. GI: Abdomen is soft, nontender, nondistended, no abdominal masses Skin: No rashes, bruises or suspicious lesions. Neurologic: Grossly intact, no focal deficits, moving all 4 extremities. Psychiatric: Normal mood and affect.  Laboratory Data: NA  Urinalysis N/a  Pertinent Imaging: NA  Assessment & Plan:    1. Prostate nodule We had a lengthy discussion today about the natural history of prostate cancer and guidelines for PSA screening including PSA/DRE.  Given that he is 84 years old with multiple medical comorbidities, his overall life expectancy is most certainly less than 10 years.  Additionally, we generally not recommend screening after the age about 9-75 depending on the patient's medical comorbidities.  He does have a mildly elevated PSA with a nodular prostate exam per his primary care physician.  We discussed today that even if he does have prostate cancer, this will likely not affect his overall quality of life and result in morbidity or mortality.  As such, I have not recommended any further diagnostic evaluation or treatment.  The patient is understandable.  He does have some mild urinary symptoms but is overall not bothered.  If his urinary symptoms progressed, he was advised to return and will reevaluate.  2. Benign prostatic hyperplasia with post-void dribbling Minimal urinary symptoms Adequate emptying No indication for further treatment - BLADDER SCAN AMB NON-IMAGING  Return if symptoms worsen or fail to improve.  Hollice Espy, MD  [facility] [address], [place], [place] [address] [phone]

## 2017-12-10 DIAGNOSIS — I10 Essential (primary) hypertension: Secondary | ICD-10-CM | POA: Diagnosis not present

## 2017-12-10 DIAGNOSIS — I4891 Unspecified atrial fibrillation: Secondary | ICD-10-CM | POA: Diagnosis not present

## 2017-12-10 DIAGNOSIS — R5381 Other malaise: Secondary | ICD-10-CM | POA: Diagnosis not present

## 2017-12-10 DIAGNOSIS — N400 Enlarged prostate without lower urinary tract symptoms: Secondary | ICD-10-CM | POA: Diagnosis not present

## 2017-12-10 DIAGNOSIS — I509 Heart failure, unspecified: Secondary | ICD-10-CM | POA: Diagnosis not present

## 2018-01-21 DIAGNOSIS — I509 Heart failure, unspecified: Secondary | ICD-10-CM | POA: Diagnosis not present

## 2018-01-21 DIAGNOSIS — I5033 Acute on chronic diastolic (congestive) heart failure: Secondary | ICD-10-CM | POA: Diagnosis not present

## 2018-01-21 DIAGNOSIS — I350 Nonrheumatic aortic (valve) stenosis: Secondary | ICD-10-CM | POA: Diagnosis not present

## 2018-01-21 DIAGNOSIS — I4891 Unspecified atrial fibrillation: Secondary | ICD-10-CM | POA: Diagnosis not present

## 2018-02-16 DIAGNOSIS — I509 Heart failure, unspecified: Secondary | ICD-10-CM | POA: Diagnosis not present

## 2018-02-16 DIAGNOSIS — I4891 Unspecified atrial fibrillation: Secondary | ICD-10-CM | POA: Diagnosis not present

## 2018-02-16 DIAGNOSIS — I350 Nonrheumatic aortic (valve) stenosis: Secondary | ICD-10-CM | POA: Diagnosis not present

## 2018-02-16 DIAGNOSIS — I5033 Acute on chronic diastolic (congestive) heart failure: Secondary | ICD-10-CM | POA: Diagnosis not present

## 2018-02-22 DIAGNOSIS — I119 Hypertensive heart disease without heart failure: Secondary | ICD-10-CM | POA: Diagnosis not present

## 2018-02-22 DIAGNOSIS — I4891 Unspecified atrial fibrillation: Secondary | ICD-10-CM | POA: Diagnosis not present

## 2018-02-22 DIAGNOSIS — I208 Other forms of angina pectoris: Secondary | ICD-10-CM | POA: Diagnosis not present

## 2018-02-22 DIAGNOSIS — I10 Essential (primary) hypertension: Secondary | ICD-10-CM | POA: Diagnosis not present

## 2018-03-03 DIAGNOSIS — N400 Enlarged prostate without lower urinary tract symptoms: Secondary | ICD-10-CM | POA: Diagnosis not present

## 2018-03-03 DIAGNOSIS — I10 Essential (primary) hypertension: Secondary | ICD-10-CM | POA: Diagnosis not present

## 2018-03-03 DIAGNOSIS — I4891 Unspecified atrial fibrillation: Secondary | ICD-10-CM | POA: Diagnosis not present

## 2018-03-03 DIAGNOSIS — I208 Other forms of angina pectoris: Secondary | ICD-10-CM | POA: Diagnosis not present

## 2018-03-23 DIAGNOSIS — I119 Hypertensive heart disease without heart failure: Secondary | ICD-10-CM | POA: Diagnosis not present

## 2018-03-23 DIAGNOSIS — I4891 Unspecified atrial fibrillation: Secondary | ICD-10-CM | POA: Diagnosis not present

## 2018-03-23 DIAGNOSIS — I350 Nonrheumatic aortic (valve) stenosis: Secondary | ICD-10-CM | POA: Diagnosis not present

## 2018-04-23 DIAGNOSIS — I350 Nonrheumatic aortic (valve) stenosis: Secondary | ICD-10-CM | POA: Diagnosis not present

## 2018-04-23 DIAGNOSIS — I119 Hypertensive heart disease without heart failure: Secondary | ICD-10-CM | POA: Diagnosis not present

## 2018-04-23 DIAGNOSIS — I509 Heart failure, unspecified: Secondary | ICD-10-CM | POA: Diagnosis not present

## 2018-04-23 DIAGNOSIS — I4891 Unspecified atrial fibrillation: Secondary | ICD-10-CM | POA: Diagnosis not present

## 2018-05-24 DIAGNOSIS — N400 Enlarged prostate without lower urinary tract symptoms: Secondary | ICD-10-CM | POA: Diagnosis not present

## 2018-05-24 DIAGNOSIS — I4891 Unspecified atrial fibrillation: Secondary | ICD-10-CM | POA: Diagnosis not present

## 2018-05-24 DIAGNOSIS — I350 Nonrheumatic aortic (valve) stenosis: Secondary | ICD-10-CM | POA: Diagnosis not present

## 2018-05-24 DIAGNOSIS — I509 Heart failure, unspecified: Secondary | ICD-10-CM | POA: Diagnosis not present

## 2018-05-25 DIAGNOSIS — K625 Hemorrhage of anus and rectum: Secondary | ICD-10-CM | POA: Diagnosis not present

## 2018-05-25 DIAGNOSIS — I10 Essential (primary) hypertension: Secondary | ICD-10-CM | POA: Diagnosis not present

## 2018-05-25 DIAGNOSIS — Z125 Encounter for screening for malignant neoplasm of prostate: Secondary | ICD-10-CM | POA: Diagnosis not present

## 2018-05-25 DIAGNOSIS — R5381 Other malaise: Secondary | ICD-10-CM | POA: Diagnosis not present

## 2018-05-25 DIAGNOSIS — I4891 Unspecified atrial fibrillation: Secondary | ICD-10-CM | POA: Diagnosis not present

## 2018-06-01 DIAGNOSIS — N400 Enlarged prostate without lower urinary tract symptoms: Secondary | ICD-10-CM | POA: Diagnosis not present

## 2018-06-01 DIAGNOSIS — I350 Nonrheumatic aortic (valve) stenosis: Secondary | ICD-10-CM | POA: Diagnosis not present

## 2018-06-01 DIAGNOSIS — I4891 Unspecified atrial fibrillation: Secondary | ICD-10-CM | POA: Diagnosis not present

## 2018-06-01 DIAGNOSIS — I509 Heart failure, unspecified: Secondary | ICD-10-CM | POA: Diagnosis not present

## 2018-06-24 DIAGNOSIS — I350 Nonrheumatic aortic (valve) stenosis: Secondary | ICD-10-CM | POA: Diagnosis not present

## 2018-06-24 DIAGNOSIS — N400 Enlarged prostate without lower urinary tract symptoms: Secondary | ICD-10-CM | POA: Diagnosis not present

## 2018-06-24 DIAGNOSIS — I119 Hypertensive heart disease without heart failure: Secondary | ICD-10-CM | POA: Diagnosis not present

## 2018-06-24 DIAGNOSIS — I4891 Unspecified atrial fibrillation: Secondary | ICD-10-CM | POA: Diagnosis not present

## 2018-07-27 DIAGNOSIS — I5033 Acute on chronic diastolic (congestive) heart failure: Secondary | ICD-10-CM | POA: Diagnosis not present

## 2018-07-27 DIAGNOSIS — I4891 Unspecified atrial fibrillation: Secondary | ICD-10-CM | POA: Diagnosis not present

## 2018-07-27 DIAGNOSIS — I10 Essential (primary) hypertension: Secondary | ICD-10-CM | POA: Diagnosis not present

## 2018-07-27 DIAGNOSIS — I208 Other forms of angina pectoris: Secondary | ICD-10-CM | POA: Diagnosis not present

## 2018-08-30 DIAGNOSIS — W19XXXA Unspecified fall, initial encounter: Secondary | ICD-10-CM | POA: Diagnosis not present

## 2018-08-30 DIAGNOSIS — I509 Heart failure, unspecified: Secondary | ICD-10-CM | POA: Diagnosis not present

## 2018-08-30 DIAGNOSIS — I10 Essential (primary) hypertension: Secondary | ICD-10-CM | POA: Diagnosis not present

## 2018-08-30 DIAGNOSIS — I4891 Unspecified atrial fibrillation: Secondary | ICD-10-CM | POA: Diagnosis not present

## 2018-09-06 DIAGNOSIS — I4891 Unspecified atrial fibrillation: Secondary | ICD-10-CM | POA: Diagnosis not present

## 2018-09-06 DIAGNOSIS — I350 Nonrheumatic aortic (valve) stenosis: Secondary | ICD-10-CM | POA: Diagnosis not present

## 2018-09-06 DIAGNOSIS — Z23 Encounter for immunization: Secondary | ICD-10-CM | POA: Diagnosis not present

## 2018-09-06 DIAGNOSIS — I119 Hypertensive heart disease without heart failure: Secondary | ICD-10-CM | POA: Diagnosis not present

## 2018-10-06 DIAGNOSIS — W19XXXA Unspecified fall, initial encounter: Secondary | ICD-10-CM | POA: Diagnosis not present

## 2018-10-06 DIAGNOSIS — I509 Heart failure, unspecified: Secondary | ICD-10-CM | POA: Diagnosis not present

## 2018-10-06 DIAGNOSIS — I4891 Unspecified atrial fibrillation: Secondary | ICD-10-CM | POA: Diagnosis not present

## 2018-10-06 DIAGNOSIS — Y92009 Unspecified place in unspecified non-institutional (private) residence as the place of occurrence of the external cause: Secondary | ICD-10-CM | POA: Diagnosis not present

## 2018-11-10 DIAGNOSIS — I509 Heart failure, unspecified: Secondary | ICD-10-CM | POA: Diagnosis not present

## 2018-11-10 DIAGNOSIS — I119 Hypertensive heart disease without heart failure: Secondary | ICD-10-CM | POA: Diagnosis not present

## 2018-11-10 DIAGNOSIS — I350 Nonrheumatic aortic (valve) stenosis: Secondary | ICD-10-CM | POA: Diagnosis not present

## 2018-11-10 DIAGNOSIS — N400 Enlarged prostate without lower urinary tract symptoms: Secondary | ICD-10-CM | POA: Diagnosis not present

## 2018-11-15 DIAGNOSIS — I4891 Unspecified atrial fibrillation: Secondary | ICD-10-CM | POA: Diagnosis not present

## 2018-11-15 DIAGNOSIS — I119 Hypertensive heart disease without heart failure: Secondary | ICD-10-CM | POA: Diagnosis not present

## 2018-11-15 DIAGNOSIS — I350 Nonrheumatic aortic (valve) stenosis: Secondary | ICD-10-CM | POA: Diagnosis not present

## 2018-11-15 DIAGNOSIS — I509 Heart failure, unspecified: Secondary | ICD-10-CM | POA: Diagnosis not present

## 2018-11-15 DIAGNOSIS — N400 Enlarged prostate without lower urinary tract symptoms: Secondary | ICD-10-CM | POA: Diagnosis not present

## 2018-12-13 DIAGNOSIS — N400 Enlarged prostate without lower urinary tract symptoms: Secondary | ICD-10-CM | POA: Diagnosis not present

## 2018-12-13 DIAGNOSIS — I119 Hypertensive heart disease without heart failure: Secondary | ICD-10-CM | POA: Diagnosis not present

## 2018-12-13 DIAGNOSIS — I509 Heart failure, unspecified: Secondary | ICD-10-CM | POA: Diagnosis not present

## 2019-01-20 ENCOUNTER — Emergency Department: Payer: Medicare HMO

## 2019-01-20 ENCOUNTER — Other Ambulatory Visit: Payer: Self-pay

## 2019-01-20 ENCOUNTER — Emergency Department
Admission: EM | Admit: 2019-01-20 | Discharge: 2019-01-20 | Disposition: A | Discharge status: Home / Self Care | Payer: Medicare HMO | Attending: Emergency Medicine | Admitting: Emergency Medicine

## 2019-01-20 ENCOUNTER — Encounter: Payer: Self-pay | Admitting: Emergency Medicine

## 2019-01-20 DIAGNOSIS — R42 Dizziness and giddiness: Secondary | ICD-10-CM | POA: Diagnosis not present

## 2019-01-20 DIAGNOSIS — R531 Weakness: Secondary | ICD-10-CM | POA: Insufficient documentation

## 2019-01-20 DIAGNOSIS — R05 Cough: Secondary | ICD-10-CM | POA: Diagnosis not present

## 2019-01-20 DIAGNOSIS — Z5321 Procedure and treatment not carried out due to patient leaving prior to being seen by health care provider: Secondary | ICD-10-CM | POA: Diagnosis not present

## 2019-01-20 LAB — CBC
HCT: 42.7 % (ref 39.0–52.0)
Hemoglobin: 15.2 g/dL (ref 13.0–17.0)
MCH: 30.2 pg (ref 26.0–34.0)
MCHC: 35.6 g/dL (ref 30.0–36.0)
MCV: 84.7 fL (ref 80.0–100.0)
Platelets: 114 10*3/uL — ABNORMAL LOW (ref 150–400)
RBC: 5.04 MIL/uL (ref 4.22–5.81)
RDW: 13 % (ref 11.5–15.5)
WBC: 7.2 10*3/uL (ref 4.0–10.5)
nRBC: 0 % (ref 0.0–0.2)

## 2019-01-20 LAB — BASIC METABOLIC PANEL
Anion gap: 13 (ref 5–15)
BUN: 28 mg/dL — ABNORMAL HIGH (ref 8–23)
CO2: 23 mmol/L (ref 22–32)
Calcium: 8.8 mg/dL — ABNORMAL LOW (ref 8.9–10.3)
Chloride: 102 mmol/L (ref 98–111)
Creatinine, Ser: 1.3 mg/dL — ABNORMAL HIGH (ref 0.61–1.24)
GFR calc Af Amer: 56 mL/min — ABNORMAL LOW (ref >60)
GFR calc non Af Amer: 48 mL/min — ABNORMAL LOW (ref >60)
Glucose, Bld: 154 mg/dL — ABNORMAL HIGH (ref 70–99)
Potassium: 3.4 mmol/L — ABNORMAL LOW (ref 3.5–5.1)
Sodium: 138 mmol/L (ref 135–145)

## 2019-01-20 LAB — TROPONIN I (HIGH SENSITIVITY): Troponin I (High Sensitivity): 16 ng/L (ref <18)

## 2019-01-20 NOTE — ED Notes (Signed)
No answer when called several times from lobby; chart reviewed and protocol added

## 2019-01-20 NOTE — ED Notes (Signed)
No answer when called several times from lobby 

## 2019-01-20 NOTE — ED Triage Notes (Addendum)
Patient presents to the ED with increased weakness and dizziness as well as cough and congestion.  Patient is hard of hearing.  No obvious distress at this time.

## 2019-01-27 ENCOUNTER — Inpatient Hospital Stay
Admission: EM | Admit: 2019-01-27 | Discharge: 2019-02-08 | DRG: 177 | Disposition: A | Discharge status: Hospice | Payer: Medicare HMO | Attending: Internal Medicine | Admitting: Internal Medicine

## 2019-01-27 ENCOUNTER — Other Ambulatory Visit: Payer: Self-pay

## 2019-01-27 ENCOUNTER — Emergency Department: Payer: Medicare HMO

## 2019-01-27 DIAGNOSIS — I10 Essential (primary) hypertension: Secondary | ICD-10-CM | POA: Diagnosis present

## 2019-01-27 DIAGNOSIS — R058 Other specified cough: Secondary | ICD-10-CM | POA: Diagnosis present

## 2019-01-27 DIAGNOSIS — J9601 Acute respiratory failure with hypoxia: Secondary | ICD-10-CM | POA: Diagnosis present

## 2019-01-27 DIAGNOSIS — H9190 Unspecified hearing loss, unspecified ear: Secondary | ICD-10-CM | POA: Diagnosis present

## 2019-01-27 DIAGNOSIS — R627 Adult failure to thrive: Secondary | ICD-10-CM | POA: Diagnosis present

## 2019-01-27 DIAGNOSIS — R680 Hypothermia, not associated with low environmental temperature: Secondary | ICD-10-CM | POA: Diagnosis present

## 2019-01-27 DIAGNOSIS — N179 Acute kidney failure, unspecified: Secondary | ICD-10-CM | POA: Diagnosis present

## 2019-01-27 DIAGNOSIS — I482 Chronic atrial fibrillation, unspecified: Secondary | ICD-10-CM | POA: Diagnosis present

## 2019-01-27 DIAGNOSIS — I4891 Unspecified atrial fibrillation: Secondary | ICD-10-CM | POA: Diagnosis not present

## 2019-01-27 DIAGNOSIS — R451 Restlessness and agitation: Secondary | ICD-10-CM | POA: Diagnosis not present

## 2019-01-27 DIAGNOSIS — R0902 Hypoxemia: Secondary | ICD-10-CM | POA: Diagnosis not present

## 2019-01-27 DIAGNOSIS — R638 Other symptoms and signs concerning food and fluid intake: Secondary | ICD-10-CM | POA: Diagnosis not present

## 2019-01-27 DIAGNOSIS — Z6820 Body mass index (BMI) 20.0-20.9, adult: Secondary | ICD-10-CM

## 2019-01-27 DIAGNOSIS — Z7901 Long term (current) use of anticoagulants: Secondary | ICD-10-CM | POA: Diagnosis not present

## 2019-01-27 DIAGNOSIS — J1282 Pneumonia due to coronavirus disease 2019: Secondary | ICD-10-CM | POA: Diagnosis present

## 2019-01-27 DIAGNOSIS — I2510 Atherosclerotic heart disease of native coronary artery without angina pectoris: Secondary | ICD-10-CM | POA: Diagnosis present

## 2019-01-27 DIAGNOSIS — R6251 Failure to thrive (child): Secondary | ICD-10-CM

## 2019-01-27 DIAGNOSIS — I959 Hypotension, unspecified: Secondary | ICD-10-CM | POA: Diagnosis not present

## 2019-01-27 DIAGNOSIS — I4811 Longstanding persistent atrial fibrillation: Secondary | ICD-10-CM | POA: Diagnosis not present

## 2019-01-27 DIAGNOSIS — E860 Dehydration: Secondary | ICD-10-CM | POA: Diagnosis present

## 2019-01-27 DIAGNOSIS — R64 Cachexia: Secondary | ICD-10-CM | POA: Diagnosis present

## 2019-01-27 DIAGNOSIS — R531 Weakness: Secondary | ICD-10-CM | POA: Diagnosis not present

## 2019-01-27 DIAGNOSIS — T68XXXA Hypothermia, initial encounter: Secondary | ICD-10-CM | POA: Diagnosis present

## 2019-01-27 DIAGNOSIS — Z515 Encounter for palliative care: Secondary | ICD-10-CM | POA: Diagnosis not present

## 2019-01-27 DIAGNOSIS — U071 COVID-19: Principal | ICD-10-CM | POA: Diagnosis present

## 2019-01-27 DIAGNOSIS — R410 Disorientation, unspecified: Secondary | ICD-10-CM | POA: Diagnosis not present

## 2019-01-27 DIAGNOSIS — R7612 Nonspecific reaction to cell mediated immunity measurement of gamma interferon antigen response without active tuberculosis: Secondary | ICD-10-CM | POA: Diagnosis not present

## 2019-01-27 DIAGNOSIS — R404 Transient alteration of awareness: Secondary | ICD-10-CM | POA: Diagnosis not present

## 2019-01-27 DIAGNOSIS — Z20822 Contact with and (suspected) exposure to covid-19: Secondary | ICD-10-CM | POA: Diagnosis present

## 2019-01-27 DIAGNOSIS — Z9189 Other specified personal risk factors, not elsewhere classified: Secondary | ICD-10-CM

## 2019-01-27 DIAGNOSIS — E870 Hyperosmolality and hypernatremia: Secondary | ICD-10-CM | POA: Diagnosis present

## 2019-01-27 DIAGNOSIS — Z7189 Other specified counseling: Secondary | ICD-10-CM | POA: Diagnosis not present

## 2019-01-27 DIAGNOSIS — Z79899 Other long term (current) drug therapy: Secondary | ICD-10-CM | POA: Diagnosis not present

## 2019-01-27 DIAGNOSIS — F0390 Unspecified dementia without behavioral disturbance: Secondary | ICD-10-CM | POA: Diagnosis present

## 2019-01-27 DIAGNOSIS — R000 Tachycardia, unspecified: Secondary | ICD-10-CM | POA: Diagnosis not present

## 2019-01-27 DIAGNOSIS — Z66 Do not resuscitate: Secondary | ICD-10-CM | POA: Diagnosis not present

## 2019-01-27 LAB — CBC WITH DIFFERENTIAL/PLATELET
Abs Immature Granulocytes: 0.04 10*3/uL (ref 0.00–0.07)
Basophils Absolute: 0 10*3/uL (ref 0.0–0.1)
Basophils Relative: 0 %
Eosinophils Absolute: 0 10*3/uL (ref 0.0–0.5)
Eosinophils Relative: 0 %
HCT: 48.1 % (ref 39.0–52.0)
Hemoglobin: 16.4 g/dL (ref 13.0–17.0)
Immature Granulocytes: 1 %
Lymphocytes Relative: 8 %
Lymphs Abs: 0.3 10*3/uL — ABNORMAL LOW (ref 0.7–4.0)
MCH: 30 pg (ref 26.0–34.0)
MCHC: 34.1 g/dL (ref 30.0–36.0)
MCV: 87.9 fL (ref 80.0–100.0)
Monocytes Absolute: 0.3 10*3/uL (ref 0.1–1.0)
Monocytes Relative: 7 %
Neutro Abs: 3.2 10*3/uL (ref 1.7–7.7)
Neutrophils Relative %: 84 %
Platelets: 105 10*3/uL — ABNORMAL LOW (ref 150–400)
RBC: 5.47 MIL/uL (ref 4.22–5.81)
RDW: 12.8 % (ref 11.5–15.5)
WBC: 3.9 10*3/uL — ABNORMAL LOW (ref 4.0–10.5)
nRBC: 0 % (ref 0.0–0.2)

## 2019-01-27 LAB — LACTIC ACID, PLASMA
Lactic Acid, Venous: 2.2 mmol/L (ref 0.5–1.9)
Lactic Acid, Venous: 1.9 mmol/L (ref 0.5–1.9)

## 2019-01-27 LAB — POC SARS CORONAVIRUS 2 AG: SARS Coronavirus 2 Ag: POSITIVE — AB

## 2019-01-27 LAB — C-REACTIVE PROTEIN: CRP: 6.5 mg/dL — ABNORMAL HIGH (ref <1.0)

## 2019-01-27 LAB — TROPONIN I (HIGH SENSITIVITY)
Troponin I (High Sensitivity): 67 ng/L — ABNORMAL HIGH (ref <18)
Troponin I (High Sensitivity): 62 ng/L — ABNORMAL HIGH (ref <18)

## 2019-01-27 LAB — COMPREHENSIVE METABOLIC PANEL
ALT: 15 U/L (ref 0–44)
AST: 45 U/L — ABNORMAL HIGH (ref 15–41)
Albumin: 2.8 g/dL — ABNORMAL LOW (ref 3.5–5.0)
Alkaline Phosphatase: 19 U/L — ABNORMAL LOW (ref 38–126)
Anion gap: 15 (ref 5–15)
BUN: 46 mg/dL — ABNORMAL HIGH (ref 8–23)
CO2: 21 mmol/L — ABNORMAL LOW (ref 22–32)
Calcium: 8.2 mg/dL — ABNORMAL LOW (ref 8.9–10.3)
Chloride: 102 mmol/L (ref 98–111)
Creatinine, Ser: 1.59 mg/dL — ABNORMAL HIGH (ref 0.61–1.24)
GFR calc Af Amer: 44 mL/min — ABNORMAL LOW (ref >60)
GFR calc non Af Amer: 38 mL/min — ABNORMAL LOW (ref >60)
Glucose, Bld: 114 mg/dL — ABNORMAL HIGH (ref 70–99)
Potassium: 4.1 mmol/L (ref 3.5–5.1)
Sodium: 138 mmol/L (ref 135–145)
Total Bilirubin: 2.7 mg/dL — ABNORMAL HIGH (ref 0.3–1.2)
Total Protein: 6.5 g/dL (ref 6.5–8.1)

## 2019-01-27 LAB — PROTIME-INR
INR: 2.3 — ABNORMAL HIGH (ref 0.8–1.2)
Prothrombin Time: 25 seconds — ABNORMAL HIGH (ref 11.4–15.2)

## 2019-01-27 LAB — FERRITIN: Ferritin: 2194 ng/mL — ABNORMAL HIGH (ref 24–336)

## 2019-01-27 LAB — FIBRINOGEN: Fibrinogen: 663 mg/dL — ABNORMAL HIGH (ref 210–475)

## 2019-01-27 LAB — PROCALCITONIN: Procalcitonin: <0.1 ng/mL

## 2019-01-27 LAB — FIBRIN DERIVATIVES D-DIMER (ARMC ONLY): Fibrin derivatives D-dimer (ARMC): 635.49 ng/mL (FEU) — ABNORMAL HIGH (ref 0.00–499.00)

## 2019-01-27 LAB — LACTATE DEHYDROGENASE: LDH: 254 U/L — ABNORMAL HIGH (ref 98–192)

## 2019-01-27 MED ORDER — ATENOLOL 25 MG PO TABS
25.0000 mg | ORAL_TABLET | Freq: Every day | ORAL | Status: DC
  Administered 2019-01-28 – 2019-02-03 (×5): 25 mg via ORAL
  Filled 2019-01-27 (×9): qty 1

## 2019-01-27 MED ORDER — ONDANSETRON HCL 4 MG/2ML IJ SOLN
4.0000 mg | Freq: Once | INTRAMUSCULAR | Status: AC
  Administered 2019-01-27: 15:00:00 4 mg via INTRAVENOUS
  Filled 2019-01-27: qty 2

## 2019-01-27 MED ORDER — SODIUM CHLORIDE 0.9 % IV SOLN: INTRAVENOUS | Status: AC

## 2019-01-27 NOTE — ED Provider Notes (Signed)
[facility] Emergency Department Provider Note       Time seen: ----------------------------------------- 1:30 PM on 01/27/2019 ----------------------------------------- I have reviewed the triage vital signs and the nursing notes.  HISTORY   Chief Complaint No chief complaint on file.   HPI Jonathon Sutton is a 86 y.o. male with a history of arthritis, atrial fibrillation, back pain, coronary artery disease, hypertension who presents to the ED for weakness and poor appetite.  Wife states he was diagnosed with COVID-19 on Tuesday.  He went to be tested on Sunday.  She states he has not been eating or drinking anything has had some cough but no significant shortness of breath.  Past Medical History:  Diagnosis Date  . Arthritis   . Atrial fibrillation ([place])   . Back pain   . CAD (coronary artery disease)   . Hypertension     There are no problems to display for this patient.   No past surgical history on file.  Allergies Patient has no known allergies.  Social History Social History   Tobacco Use  . Smoking status: Never Smoker  . Smokeless tobacco: Never Used  Substance Use Topics  . Alcohol use: Never  . Drug use: Never   Review of Systems Constitutional: Negative for fever. Cardiovascular: Negative for chest pain. Respiratory: Negative for shortness of breath. Gastrointestinal: Negative for abdominal pain, positive for poor appetite Musculoskeletal: Negative for back pain. Skin: Negative for rash. Neurological: Negative for headaches, focal weakness or numbness.  All systems negative/normal/unremarkable except as stated in the HPI  ____________________________________________   PHYSICAL EXAM:  VITAL SIGNS: ED Triage Vitals  Enc Vitals Group     BP      Pulse      Resp      Temp      Temp src      SpO2      Weight      Height      Head Circumference      Peak Flow      Pain Score      Pain Loc      Pain Edu?    Excl. in [place]?    Constitutional: Alert but disoriented.  Well appearing and in no distress. Eyes: Conjunctivae are normal. Normal extraocular movements. ENT      Head: Normocephalic and atraumatic.      Nose: No congestion/rhinnorhea.      Mouth/Throat: Mucous membranes are moist.      Neck: No stridor. Cardiovascular: Irregular regular rhythm. No murmurs, rubs, or gallops. Respiratory: Normal respiratory effort without tachypnea nor retractions. Breath sounds are clear and equal bilaterally. No wheezes/rales/rhonchi. Gastrointestinal: Soft and nontender. Normal bowel sounds Musculoskeletal: Nontender with normal range of motion in extremities. No lower extremity tenderness nor edema. Neurologic:  Normal speech and language. No gross focal neurologic deficits are appreciated.  Skin:  Skin is warm, dry and intact. No rash noted. Psychiatric: Mood and affect are normal. Speech and behavior are normal.  ____________________________________________  EKG: Interpreted by me.  Atrial fibrillation with rate of 117 bpm, borderline wide QRS, normal axis, long QT  ____________________________________________  ED COURSE:  As part of my medical decision making, I reviewed the following data within the [facility] History obtained from family if available, nursing notes, old chart and ekg, as well as notes from prior ED visits. Patient presented for possible dehydration related to COVID-19, we will assess with labs and imaging as indicated at this time.  Procedures  DAGO MOM was evaluated in Emergency Department on 01/27/2019 for the symptoms described in the history of present illness. He was evaluated in the context of the global COVID-19 pandemic, which necessitated consideration that the patient might be at risk for infection with the SARS-CoV-2 virus that causes COVID-19. Institutional protocols and algorithms that pertain to the evaluation of patients at risk for COVID-19  are in a state of rapid change based on information released by regulatory bodies including the CDC and federal and state organizations. These policies and algorithms were followed during the patient's care in the ED.  ____________________________________________   LABS (pertinent positives/negatives)  Labs Reviewed  LACTIC ACID, PLASMA - Abnormal; Notable for the following components:      Result Value   Lactic Acid, Venous 2.2 (*)    All other components within normal limits  CBC WITH DIFFERENTIAL/PLATELET - Abnormal; Notable for the following components:   WBC 3.9 (*)    Platelets 105 (*)    Lymphs Abs 0.3 (*)    All other components within normal limits  FIBRIN DERIVATIVES D-DIMER ([facility] ONLY) - Abnormal; Notable for the following components:   Fibrin derivatives D-dimer ([facility]) 635.49 (*)    All other components within normal limits  FIBRINOGEN - Abnormal; Notable for the following components:   Fibrinogen 663 (*)    All other components within normal limits  PROTIME-INR - Abnormal; Notable for the following components:   Prothrombin Time 25.0 (*)    INR 2.3 (*)    All other components within normal limits  CULTURE, BLOOD (ROUTINE X 2)  CULTURE, BLOOD (ROUTINE X 2)  LACTIC ACID, PLASMA  C-REACTIVE PROTEIN  COMPREHENSIVE METABOLIC PANEL  FERRITIN  LACTATE DEHYDROGENASE  PROCALCITONIN  TROPONIN I (HIGH SENSITIVITY)  TROPONIN I (HIGH SENSITIVITY)    RADIOLOGY Images were viewed by me  Chest x-ray  IMPRESSION: 1. Patchy bilateral interstitial and alveolar airspace disease as can be seen with multilobar pneumonia including atypical viral pneumonia. ____________________________________________   DIFFERENTIAL DIAGNOSIS   Dehydration, electrolyte abnormality, COVID-19, pneumonia, renal failure, MI  FINAL ASSESSMENT AND PLAN  COVID-19, dehydration, atrial fibrillation   Plan: The patient had presented for possible dehydration. Patient's labs did indicate some  dehydration. Patient's imaging were consistent with Covid with bilateral interstitial and alveolar airspace disease.  Patient has received IV fluids, we will give Decadron.  Patient was in rapid A. fib but now has converted to a sinus rhythm.  I will discuss with the hospitalist for admission.   Laurence Aly, MD    Note: This note was generated in part or whole with voice recognition software. Voice recognition is usually quite accurate but there are transcription errors that can and very often do occur. I apologize for any typographical errors that were not detected and corrected.    Earleen Newport, MD 01/27/19 [id number]

## 2019-01-27 NOTE — ED Notes (Signed)
Pt taken off oxygen by MD, pt sats around 87%. Placed pt back on 2 L of O2. Will notify doctor.

## 2019-01-27 NOTE — ED Notes (Signed)
Patient placed on bair hugger at this time.  Temp setting at Medium: 36 degrees C.

## 2019-01-27 NOTE — ED Notes (Signed)
Patient's daughter updated via phone call Donato Heinz [phone].

## 2019-01-27 NOTE — ED Triage Notes (Signed)
BIBA from for weakness 2/2 known COVID+ diagnosis. A&O in NAD. In afib on RA.

## 2019-01-27 NOTE — H&P (Signed)
History and Physical    Jonathon Sutton [id number] DOB: October 18, 1926 DOA: 01/27/2019  PCP: Cletis Athens, MD  Patient coming from: home  I have personally briefly reviewed patient's old medical records in [facility]  Chief Complaint: not eating or drinking  HPI: Jonathon Sutton is a 86 y.o. male with medical history significant of Afib on Eliquis, HTN, CAD who was brought to the ED by his wife for eating eating or drinking.   History obtained from daughter due to pt's being extremely hard of hearing and somewhat confused.  Per daughter, for the past 1-2 weeks, pt had been more confused, weak, unsteady on his feet, and not eating or drinking because everything tasted bad to him.  Pt was also coughing with production of yellow/green sputum, for which his outpatient doctor had prescribed 5 days of Levaquin (unclear if pt finished the course).  At baseline, pt is forgetful and doesn't walk very well (but doesn't use a walker), but is much worse recently.  Pt had a COVID test done around 01/22/19 that came back positive.  Pt lives with his 77 y.o. wife who is also COVID positive.  Pt presented to the ED on 01/20/19 for similar complaints, but left due to the long wait.  ED Course: initial vitals: temp 94 (oral), BP 111/92, sating 95% on room air.  CBC unremarkable, Cr 1.59 (unknown baseline), BUN 46, total bili 2.7, trop 67, lactic acid 2.2, procal neg, CXR showed "Patchy bilateral interstitial and alveolar airspace disease."  Pt was found to be in Afib with variable rates.  Assessment/Plan Principal Problem:   Poor fluid intake Active Problems:   Weakness generalized   Atrial fibrillation (HCC)   CAD (coronary artery disease)   COVID-19 virus infection   At risk for dehydration due to poor fluid intake   Confusion   Cough with exposure to COVID-19 virus   Hypothermia   # COVID-19 infection --dx around 01/22/19, positive for cough productive of sputum, change in taste sensation,  poor PO intake, confusion.  CXR showed "Patchy bilateral interstitial and alveolar airspace disease", however, not hypoxic.  Procal neg. PLAN: --No need to start Remdesivir currently since no O2 requirement --follow up CRP to see if need to start steroids  # Poor PO intake # Confusion # Weakness --Didn't eat or drink well for at least a week reportedly due to change of taste.  Likely an effect from COVID infection.   PLAN: --NS@100  for 10 hours for hydration --PT/OT for likely SNF short-term placement  # Afib on home Eliquis --variable rate, mostly controlled PLAN: --continue home atenolol and Eliquis  # Hx of HTN --BP currently low normal PLAN: --continue home atenolol --hold home Isosorbide mononitrate and Maxzide  # Hx of CAD --continue home statin     DVT prophylaxis: [id number] Code Status: Full code  Family Communication: updated daughter on the phone  Disposition Plan: Likely SNF rehab  Consults called: none Admission status: Observation   Review of Systems: As per HPI otherwise 10 point review of systems negative.   Past Medical History:  Diagnosis Date  . Arthritis   . Atrial fibrillation ([place])   . Back pain   . CAD (coronary artery disease)   . Hypertension     No past surgical history on file.   reports that he has never smoked. He has never used smokeless tobacco. He reports that he does not drink alcohol or use drugs.  No Known Allergies  No family history  on file.   Prior to Admission medications   Medication Sig Start Date End Date Taking? Authorizing Provider  atenolol (TENORMIN) 25 MG tablet Take 25 mg by mouth daily.  11/09/17  Yes [provider]  ELIQUIS 5 MG TABS tablet Take 5 mg by mouth 2 (two) times daily. 01/01/19  Yes [provider]  isosorbide mononitrate (ISMO,MONOKET) 20 MG tablet Take 20 mg by mouth 3 (three) times daily.  10/15/17  Yes [provider]  levofloxacin (LEVAQUIN) 500 MG tablet Take 500  mg by mouth daily. 01/24/19  Yes [provider]  loratadine (CLARITIN) 10 MG tablet Take 10 mg by mouth daily.  11/07/17  Yes [provider]  lovastatin (MEVACOR) 20 MG tablet Take 20 mg by mouth at bedtime.  11/09/17  Yes [provider]  triamterene-hydrochlorothiazide (MAXZIDE-25) 37.5-25 MG tablet Take 1 tablet by mouth every other day.  09/22/17  Yes [provider]    Physical Exam: Vitals:   01/27/19 1430 01/27/19 1500 01/27/19 1530 01/27/19 1600  BP: 99/81 97/77 101/67 119/76  Pulse: 97 (!) 109 (!) 56 (!) 59  Resp: 15 18 20 18   Temp:      TempSrc:      SpO2: 93% 91% 97% 97%  Weight:      Height:        Constitutional: NAD, alert, oriented to self, clear speech HEENT: conjunctivae and lids normal, EOMI, can not hear at all CV: faint heart sounds. Distal pulses +2.  No cyanosis.   RESP: CTA B/L over anterior, normal respiratory effort, on RA GI: +BS, NTND Extremities: No effusions, edema, or tenderness in BLE SKIN: warm, dry and intact Neuro: II - XII grossly intact.  Sensation intact Psych: Normal mood and affect.     Labs on Admission: I have personally reviewed following labs and imaging studies  CBC: Recent Labs  Lab 01/27/19 1350  WBC 3.9*  NEUTROABS 3.2  HGB 16.4  HCT 48.1  MCV 87.9  PLT 123456*   Basic Metabolic Panel: Recent Labs  Lab 01/27/19 1432  NA 138  K 4.1  CL 102  CO2 21*  GLUCOSE 114*  BUN 46*  CREATININE 1.59*  CALCIUM 8.2*   GFR: Estimated Creatinine Clearance: 29.7 mL/min (A) (by C-G formula based on SCr of 1.59 mg/dL (H)). Liver Function Tests: Recent Labs  Lab 01/27/19 1432  AST 45*  ALT 15  ALKPHOS 19*  BILITOT 2.7*  PROT 6.5  ALBUMIN 2.8*   No results for input(s): LIPASE, AMYLASE in the last 168 hours. No results for input(s): AMMONIA in the last 168 hours. Coagulation Profile: Recent Labs  Lab 01/27/19 1350  INR 2.3*   Cardiac Enzymes: No results for input(s): CKTOTAL, CKMB,  CKMBINDEX, TROPONINI in the last 168 hours. BNP (last 3 results) No results for input(s): PROBNP in the last 8760 hours. HbA1C: No results for input(s): HGBA1C in the last 72 hours. CBG: No results for input(s): GLUCAP in the last 168 hours. Lipid Profile: No results for input(s): CHOL, HDL, LDLCALC, TRIG, CHOLHDL, LDLDIRECT in the last 72 hours. Thyroid Function Tests: No results for input(s): TSH, T4TOTAL, FREET4, T3FREE, THYROIDAB in the last 72 hours. Anemia Panel: Recent Labs    01/27/19 1432  FERRITIN 2,194*   Urine analysis: No results found for: COLORURINE, APPEARANCEUR, LABSPEC, PHURINE, GLUCOSEU, HGBUR, BILIRUBINUR, KETONESUR, PROTEINUR, UROBILINOGEN, NITRITE, LEUKOCYTESUR  Radiological Exams on Admission: DG Chest Port 1 View  Result Date: 01/27/2019 CLINICAL DATA:  COVID 19 positive EXAM:  PORTABLE CHEST 1 VIEW COMPARISON:  01/20/2019 FINDINGS: Bilateral patchy interstitial and alveolar airspace disease. No pleural effusion or pneumothorax. Stable cardiomediastinal silhouette. No acute osseous abnormality. IMPRESSION: 1. Patchy bilateral interstitial and alveolar airspace disease as can be seen with multilobar pneumonia including atypical viral pneumonia. Electronically Signed   By: Kathreen Devoid   On: 01/27/2019 14:18      Enzo Bi MD [facility]  If 7PM-7AM, please contact night-coverage 01/27/2019, 5:12 PM

## 2019-01-27 NOTE — ED Notes (Signed)
Pt's underwear wet with urine. Pt changed into clean brief and chuck placed on bed. Pt clean/dry at this time.

## 2019-01-28 DIAGNOSIS — Z66 Do not resuscitate: Secondary | ICD-10-CM | POA: Diagnosis not present

## 2019-01-28 DIAGNOSIS — I4811 Longstanding persistent atrial fibrillation: Secondary | ICD-10-CM | POA: Diagnosis not present

## 2019-01-28 DIAGNOSIS — R451 Restlessness and agitation: Secondary | ICD-10-CM | POA: Diagnosis not present

## 2019-01-28 DIAGNOSIS — N179 Acute kidney failure, unspecified: Secondary | ICD-10-CM | POA: Diagnosis present

## 2019-01-28 DIAGNOSIS — J1282 Pneumonia due to coronavirus disease 2019: Secondary | ICD-10-CM | POA: Diagnosis present

## 2019-01-28 DIAGNOSIS — I959 Hypotension, unspecified: Secondary | ICD-10-CM | POA: Diagnosis not present

## 2019-01-28 DIAGNOSIS — Z6820 Body mass index (BMI) 20.0-20.9, adult: Secondary | ICD-10-CM | POA: Diagnosis not present

## 2019-01-28 DIAGNOSIS — F0390 Unspecified dementia without behavioral disturbance: Secondary | ICD-10-CM | POA: Diagnosis present

## 2019-01-28 DIAGNOSIS — U071 COVID-19: Secondary | ICD-10-CM | POA: Diagnosis present

## 2019-01-28 DIAGNOSIS — Z7189 Other specified counseling: Secondary | ICD-10-CM | POA: Diagnosis not present

## 2019-01-28 DIAGNOSIS — Z515 Encounter for palliative care: Secondary | ICD-10-CM | POA: Diagnosis not present

## 2019-01-28 DIAGNOSIS — H9190 Unspecified hearing loss, unspecified ear: Secondary | ICD-10-CM | POA: Diagnosis present

## 2019-01-28 DIAGNOSIS — R638 Other symptoms and signs concerning food and fluid intake: Secondary | ICD-10-CM | POA: Diagnosis not present

## 2019-01-28 DIAGNOSIS — R531 Weakness: Secondary | ICD-10-CM | POA: Diagnosis not present

## 2019-01-28 DIAGNOSIS — R680 Hypothermia, not associated with low environmental temperature: Secondary | ICD-10-CM | POA: Diagnosis present

## 2019-01-28 DIAGNOSIS — E870 Hyperosmolality and hypernatremia: Secondary | ICD-10-CM | POA: Diagnosis present

## 2019-01-28 DIAGNOSIS — Z7901 Long term (current) use of anticoagulants: Secondary | ICD-10-CM | POA: Diagnosis not present

## 2019-01-28 DIAGNOSIS — Z79899 Other long term (current) drug therapy: Secondary | ICD-10-CM | POA: Diagnosis not present

## 2019-01-28 DIAGNOSIS — I482 Chronic atrial fibrillation, unspecified: Secondary | ICD-10-CM | POA: Diagnosis present

## 2019-01-28 DIAGNOSIS — R627 Adult failure to thrive: Secondary | ICD-10-CM | POA: Diagnosis present

## 2019-01-28 DIAGNOSIS — E860 Dehydration: Secondary | ICD-10-CM | POA: Diagnosis present

## 2019-01-28 DIAGNOSIS — R64 Cachexia: Secondary | ICD-10-CM | POA: Diagnosis present

## 2019-01-28 DIAGNOSIS — J9601 Acute respiratory failure with hypoxia: Secondary | ICD-10-CM | POA: Diagnosis present

## 2019-01-28 DIAGNOSIS — I2510 Atherosclerotic heart disease of native coronary artery without angina pectoris: Secondary | ICD-10-CM | POA: Diagnosis present

## 2019-01-28 DIAGNOSIS — I10 Essential (primary) hypertension: Secondary | ICD-10-CM | POA: Diagnosis present

## 2019-01-28 DIAGNOSIS — I4891 Unspecified atrial fibrillation: Secondary | ICD-10-CM | POA: Diagnosis not present

## 2019-01-28 DIAGNOSIS — R410 Disorientation, unspecified: Secondary | ICD-10-CM | POA: Diagnosis not present

## 2019-01-28 LAB — CBC
HCT: 45.5 % (ref 39.0–52.0)
Hemoglobin: 15.5 g/dL (ref 13.0–17.0)
MCH: 30.1 pg (ref 26.0–34.0)
MCHC: 34.1 g/dL (ref 30.0–36.0)
MCV: 88.3 fL (ref 80.0–100.0)
Platelets: 113 10*3/uL — ABNORMAL LOW (ref 150–400)
RBC: 5.15 MIL/uL (ref 4.22–5.81)
RDW: 12.7 % (ref 11.5–15.5)
WBC: 3.7 10*3/uL — ABNORMAL LOW (ref 4.0–10.5)
nRBC: 0 % (ref 0.0–0.2)

## 2019-01-28 LAB — BASIC METABOLIC PANEL
Anion gap: 13 (ref 5–15)
BUN: 36 mg/dL — ABNORMAL HIGH (ref 8–23)
CO2: 23 mmol/L (ref 22–32)
Calcium: 8.1 mg/dL — ABNORMAL LOW (ref 8.9–10.3)
Chloride: 104 mmol/L (ref 98–111)
Creatinine, Ser: 1.53 mg/dL — ABNORMAL HIGH (ref 0.61–1.24)
GFR calc Af Amer: 46 mL/min — ABNORMAL LOW (ref >60)
GFR calc non Af Amer: 39 mL/min — ABNORMAL LOW (ref >60)
Glucose, Bld: 97 mg/dL (ref 70–99)
Potassium: 3.1 mmol/L — ABNORMAL LOW (ref 3.5–5.1)
Sodium: 140 mmol/L (ref 135–145)

## 2019-01-28 LAB — MAGNESIUM: Magnesium: 1.9 mg/dL (ref 1.7–2.4)

## 2019-01-28 MED ORDER — SODIUM CHLORIDE 0.9 % IV SOLN
100.0000 mg | Freq: Every day | INTRAVENOUS | Status: DC
  Administered 2019-01-29 – 2019-01-31 (×3): 100 mg via INTRAVENOUS
  Filled 2019-01-28: qty 20
  Filled 2019-01-28 (×3): qty 100

## 2019-01-28 MED ORDER — SODIUM CHLORIDE 0.9 % IV SOLN
200.0000 mg | Freq: Once | INTRAVENOUS | Status: DC
  Filled 2019-01-28: qty 40

## 2019-01-28 MED ORDER — DEXAMETHASONE 4 MG PO TABS
6.0000 mg | ORAL_TABLET | Freq: Every day | ORAL | Status: DC
  Administered 2019-01-28 – 2019-02-04 (×8): 6 mg via ORAL
  Filled 2019-01-28 (×9): qty 1.5

## 2019-01-28 MED ORDER — ALUM & MAG HYDROXIDE-SIMETH 200-200-20 MG/5ML PO SUSP: 15.0000 mL | Freq: Four times a day (QID) | ORAL | Status: DC | PRN

## 2019-01-28 MED ORDER — ONDANSETRON 4 MG PO TBDP
4.0000 mg | ORAL_TABLET | Freq: Three times a day (TID) | ORAL | Status: DC | PRN
  Filled 2019-01-28: qty 1

## 2019-01-28 MED ORDER — POLYETHYLENE GLYCOL 3350 17 G PO PACK: 17.0000 g | PACK | Freq: Two times a day (BID) | ORAL | Status: DC | PRN

## 2019-01-28 MED ORDER — ONDANSETRON HCL 4 MG/2ML IJ SOLN: 4.0000 mg | Freq: Four times a day (QID) | INTRAMUSCULAR | Status: DC | PRN

## 2019-01-28 MED ORDER — GUAIFENESIN-DM 100-10 MG/5ML PO SYRP
10.0000 mL | ORAL_SOLUTION | Freq: Four times a day (QID) | ORAL | Status: DC | PRN
  Filled 2019-01-28: qty 10

## 2019-01-28 MED ORDER — ASCORBIC ACID 500 MG PO TABS
500.0000 mg | ORAL_TABLET | Freq: Every day | ORAL | Status: DC
  Administered 2019-01-28 – 2019-01-31 (×4): 500 mg via ORAL
  Filled 2019-01-28 (×8): qty 1

## 2019-01-28 MED ORDER — DOCUSATE SODIUM 100 MG PO CAPS
100.0000 mg | ORAL_CAPSULE | Freq: Two times a day (BID) | ORAL | Status: DC | PRN
  Administered 2019-01-29: 100 mg via ORAL
  Filled 2019-01-28: qty 1

## 2019-01-28 MED ORDER — PRAVASTATIN SODIUM 20 MG PO TABS
20.0000 mg | ORAL_TABLET | Freq: Every day | ORAL | Status: DC
  Administered 2019-01-28 – 2019-01-31 (×4): 20 mg via ORAL
  Filled 2019-01-28 (×9): qty 1

## 2019-01-28 MED ORDER — APIXABAN 5 MG PO TABS
5.0000 mg | ORAL_TABLET | Freq: Two times a day (BID) | ORAL | Status: DC
  Administered 2019-01-28: 5 mg via ORAL
  Filled 2019-01-28: qty 1

## 2019-01-28 MED ORDER — POTASSIUM CHLORIDE CRYS ER 20 MEQ PO TBCR
40.0000 meq | EXTENDED_RELEASE_TABLET | Freq: Once | ORAL | Status: AC
  Administered 2019-01-28: 40 meq via ORAL
  Filled 2019-01-28: qty 2

## 2019-01-28 MED ORDER — ZINC SULFATE 220 (50 ZN) MG PO CAPS
220.0000 mg | ORAL_CAPSULE | Freq: Every day | ORAL | Status: DC
  Administered 2019-01-28 – 2019-01-31 (×4): 220 mg via ORAL
  Filled 2019-01-28 (×8): qty 1

## 2019-01-28 MED ORDER — APIXABAN 2.5 MG PO TABS
2.5000 mg | ORAL_TABLET | Freq: Two times a day (BID) | ORAL | Status: DC
  Administered 2019-01-28 – 2019-01-29 (×3): 2.5 mg via ORAL
  Filled 2019-01-28 (×4): qty 1

## 2019-01-28 MED ORDER — ACETAMINOPHEN 500 MG PO TABS
1000.0000 mg | ORAL_TABLET | Freq: Three times a day (TID) | ORAL | Status: DC | PRN
  Administered 2019-01-28 – 2019-01-30 (×2): 1000 mg via ORAL
  Filled 2019-01-28 (×3): qty 2

## 2019-01-28 MED ORDER — CALCIUM CARBONATE ANTACID 500 MG PO CHEW: 1.0000 | CHEWABLE_TABLET | Freq: Three times a day (TID) | ORAL | Status: DC | PRN

## 2019-01-28 NOTE — Progress Notes (Signed)
PROGRESS NOTE    Jonathon Sutton  [id number] DOB: 18-Dec-1926 DOA: 01/27/2019 PCP: Cletis Athens, MD    Assessment & Plan:   Principal Problem:   Poor fluid intake Active Problems:   Weakness generalized   Atrial fibrillation (HCC)   CAD (coronary artery disease)   COVID-19 virus infection   At risk for dehydration due to poor fluid intake   Confusion   Cough with exposure to COVID-19 virus   Hypothermia   Pneumonia due to COVID-19 virus    Jonathon Sutton is a 86 y.o. male with medical history significant of Afib on Eliquis, HTN, CAD who was brought to the ED by his wife for eating eating or drinking.    # Acute hypoxic respiratory failure 2/2 COVID-19 infection --dx around 01/22/19, positive for cough productive of sputum, change in taste sensation, poor PO intake, confusion.  CXR showed "Patchy bilateral interstitial and alveolar airspace disease".  Procal neg. --While working with PT/OT, "Pt tolerated sitting EOB for a few minutes. O2 sats dropped to low 80s in sitting and quickly returned to 90%+ after return to supine. Pt on 2L [place] entire session."   PLAN: --Start Remdesivir and PO decadron --continue suppl O2, wean as able --Zinc and vit C  # Poor PO intake # Confusion # Weakness --Didn't eat or drink well for at least a week reportedly due to change of taste.  Likely an effect from COVID infection.  --s/p S@100  for 10 hours for hydration  PLAN: --PT/OT rec SNF rehab  # Afib on home Eliquis --variable rate, mostly controlled PLAN: --continue home atenolol and Eliquis  # Hx of HTN --BP currently low normal PLAN: --continue home atenolol --hold home Isosorbide mononitrate and Maxzide  # Hx of CAD --continue home statin     DVT prophylaxis: [id number] Code Status: Full code  Family Communication: not today Disposition Plan: SNF.  Pt is not safe for discharge home.   Subjective and Interval History:  Can not obtain hx as pt can't hear.  While  working with PT/OT, "Pt tolerated sitting EOB for a few minutes. O2 sats dropped to low 80s in sitting and quickly returned to 90%+ after return to supine. Pt on 2L [place] entire session."     Objective: Vitals:   01/28/19 1130 01/28/19 1200 01/28/19 1251 01/28/19 1524  BP: 132/63 (!) 104/59 (!) 140/98 (!) 159/73  Pulse: 65 (!) 58 63 71  Resp: (!) 25 (!) 21 18 20   Temp:   98.7 F (37.1 C) 98.4 F (36.9 C)  TempSrc:   Axillary Axillary  SpO2: 92% 93% 95% 92%  Weight:      Height:       No intake or output data in the 24 hours ending 01/28/19 1830 Filed Weights   01/27/19 1356  Weight: 68 kg    Examination:   Constitutional: NAD, alert, not oriented, not quite coherent HEENT: conjunctivae and lids normal, EOMI, deaf CV: RRR no M,R,G. Distal pulses +2.  No cyanosis.   RESP: CTA B/L, normal respiratory effort, on 2L  GI: +BS, NTND Extremities: No effusions, edema, or tenderness in BLE SKIN: warm, dry and intact Neuro: II - XII grossly intact.  Sensation intact   Data Reviewed: I have personally reviewed following labs and imaging studies  CBC: Recent Labs  Lab 01/27/19 1350 01/28/19 0551  WBC 3.9* 3.7*  NEUTROABS 3.2  --   HGB 16.4 15.5  HCT 48.1 45.5  MCV 87.9 88.3  PLT 105* 113*  Basic Metabolic Panel: Recent Labs  Lab 01/27/19 1432 01/28/19 0551  NA 138 140  K 4.1 3.1*  CL 102 104  CO2 21* 23  GLUCOSE 114* 97  BUN 46* 36*  CREATININE 1.59* 1.53*  CALCIUM 8.2* 8.1*  MG  --  1.9   GFR: Estimated Creatinine Clearance: 30.9 mL/min (A) (by C-G formula based on SCr of 1.53 mg/dL (H)). Liver Function Tests: Recent Labs  Lab 01/27/19 1432  AST 45*  ALT 15  ALKPHOS 19*  BILITOT 2.7*  PROT 6.5  ALBUMIN 2.8*   No results for input(s): LIPASE, AMYLASE in the last 168 hours. No results for input(s): AMMONIA in the last 168 hours. Coagulation Profile: Recent Labs  Lab 01/27/19 1350  INR 2.3*   Cardiac Enzymes: No results for input(s): CKTOTAL, CKMB,  CKMBINDEX, TROPONINI in the last 168 hours. BNP (last 3 results) No results for input(s): PROBNP in the last 8760 hours. HbA1C: No results for input(s): HGBA1C in the last 72 hours. CBG: No results for input(s): GLUCAP in the last 168 hours. Lipid Profile: No results for input(s): CHOL, HDL, LDLCALC, TRIG, CHOLHDL, LDLDIRECT in the last 72 hours. Thyroid Function Tests: No results for input(s): TSH, T4TOTAL, FREET4, T3FREE, THYROIDAB in the last 72 hours. Anemia Panel: Recent Labs    01/27/19 1432  FERRITIN 2,194*   Sepsis Labs: Recent Labs  Lab 01/27/19 1342 01/27/19 1432 01/27/19 1601  PROCALCITON  --  <0.10  --   LATICACIDVEN 2.2*  --  1.9    Recent Results (from the past 240 hour(s))  Blood Culture (routine x 2)     Status: None (Preliminary result)   Collection Time: 01/27/19  1:50 PM   Specimen: BLOOD  Result Value Ref Range Status   Specimen Description BLOOD LEFT ANTECUBITAL  Final   Special Requests   Final    BOTTLES DRAWN AEROBIC AND ANAEROBIC Blood Culture adequate volume   Culture   Final    NO GROWTH < 24 HOURS Performed at [facility], [address] [place], [place] [address]    Report Status PENDING  Incomplete  Blood Culture (routine x 2)     Status: None (Preliminary result)   Collection Time: 01/27/19  1:50 PM   Specimen: BLOOD  Result Value Ref Range Status   Specimen Description BLOOD BLOOD RIGHT FOREARM  Final   Special Requests   Final    BOTTLES DRAWN AEROBIC AND ANAEROBIC Blood Culture adequate volume   Culture   Final    NO GROWTH < 24 HOURS Performed at [facility], [address], [place], [place] [address]    Report Status PENDING  Incomplete      Radiology Studies: DG Chest Port 1 View  Result Date: 01/27/2019 CLINICAL DATA:  COVID 19 positive EXAM: PORTABLE CHEST 1 VIEW COMPARISON:  01/20/2019 FINDINGS: Bilateral patchy interstitial and alveolar airspace disease. No pleural effusion or pneumothorax. Stable  cardiomediastinal silhouette. No acute osseous abnormality. IMPRESSION: 1. Patchy bilateral interstitial and alveolar airspace disease as can be seen with multilobar pneumonia including atypical viral pneumonia. Electronically Signed   By: Kathreen Devoid   On: 01/27/2019 14:18     Scheduled Meds: . apixaban  2.5 mg Oral BID  . atenolol  25 mg Oral Daily  . dexamethasone  6 mg Oral Daily  . pravastatin  20 mg Oral q1800   Continuous Infusions: . remdesivir 200 mg in sodium chloride 0.9% 250 mL IVPB     Followed by  . [  START ON 01/29/2019] remdesivir 100 mg in NS 100 mL       LOS: 0 days     Enzo Bi, MD [facility] If 7PM-7AM, please contact night-coverage 01/28/2019, 6:30 PM

## 2019-01-28 NOTE — NC FL2 (Signed)
[place] LEVEL OF CARE SCREENING TOOL     IDENTIFICATION  Patient Name: Jonathon Sutton Birthdate: [birth year] Sex: male Admission Date (Current Location): 01/27/2019  [place] and [place] Number:  Engineering geologist and Address:  [facility], [address] [place], [place] [address]      Provider Number: [id number]  Attending Physician Name and Address:  Enzo Bi, MD  Relative Name and Phone Number:       Current Level of Care: Hospital Recommended Level of Care: [place] Prior Approval Number:    Date Approved/Denied:   PASRR Number: [id number]  Discharge Plan: SNF    Current Diagnoses: Patient Active Problem List   Diagnosis Date Noted  . Weakness generalized 01/27/2019  . COVID-19 virus infection 01/27/2019  . At risk for dehydration due to poor fluid intake 01/27/2019  . Poor fluid intake 01/27/2019  . Confusion 01/27/2019  . Cough with exposure to COVID-19 virus 01/27/2019  . Hypothermia 01/27/2019  . Atrial fibrillation ([place])   . CAD (coronary artery disease)     Orientation RESPIRATION BLADDER Height & Weight     Time, Situation, Place, Self  O2(NC2L) Continent Weight: 149 lb 14.6 oz (68 kg) Height:  5\' 9"  (175.3 cm)  BEHAVIORAL SYMPTOMS/MOOD NEUROLOGICAL BOWEL NUTRITION STATUS      Continent Diet(regular diet)  AMBULATORY STATUS COMMUNICATION OF NEEDS Skin   Extensive Assist Verbally Normal                       Personal Care Assistance Level of Assistance  Bathing, Feeding, Dressing Bathing Assistance: Maximum assistance Feeding assistance: Independent Dressing Assistance: Maximum assistance     Functional Limitations Info  Sight, Hearing, Speech Sight Info: Adequate Hearing Info: Adequate Speech Info: Adequate    SPECIAL CARE FACTORS FREQUENCY  PT (By licensed PT), OT (By licensed OT)     PT Frequency: 5x OT Frequency: 5x            Contractures Contractures Info:  Not present    Additional Factors Info  Code Status, Allergies, Isolation Precautions Code Status Info: Full Code Allergies Info: No known allergies     Isolation Precautions Info: COVID +     Current Medications (01/28/2019):  This is the current hospital active medication list Current Facility-Administered Medications  Medication Dose Route Frequency Provider Last Rate Last Admin  . acetaminophen (TYLENOL) tablet 1,000 mg  1,000 mg Oral Q8H PRN Enzo Bi, MD      . alum & mag hydroxide-simeth (MAALOX/MYLANTA) 200-200-20 MG/5ML suspension 15 mL  15 mL Oral Q6H PRN Enzo Bi, MD      . apixaban [facility] [place]) tablet 2.5 mg  2.5 mg Oral BID Enzo Bi, MD   2.5 mg at 01/28/19 1100  . atenolol (TENORMIN) tablet 25 mg  25 mg Oral Daily Enzo Bi, MD   25 mg at 01/28/19 1100  . calcium carbonate (TUMS - dosed in mg elemental calcium) chewable tablet 200 mg of elemental calcium  1 tablet Oral TID PRN Enzo Bi, MD      . docusate sodium (COLACE) capsule 100 mg  100 mg Oral BID PRN Enzo Bi, MD      . guaiFENesin-dextromethorphan (ROBITUSSIN DM) 100-10 MG/5ML syrup 10 mL  10 mL Oral Q6H PRN Enzo Bi, MD      . ondansetron [facility]) injection 4 mg  4 mg Intravenous Q6H PRN Enzo Bi, MD      . ondansetron (ZOFRAN-ODT)  disintegrating tablet 4 mg  4 mg Oral Q8H PRN Enzo Bi, MD      . polyethylene glycol (MIRALAX / GLYCOLAX) packet 17 g  17 g Oral BID PRN Enzo Bi, MD      . pravastatin (PRAVACHOL) tablet 20 mg  20 mg Oral q1800 Enzo Bi, MD         Discharge Medications: Please see discharge summary for a list of discharge medications.  Relevant Imaging Results:  Relevant Lab Results:   Additional Information [id number]  Gerrianne Scale Ailany Koren, LCSW

## 2019-01-28 NOTE — Evaluation (Signed)
Occupational Therapy Evaluation Patient Details Name: Jonathon Sutton MRN: [id number] DOB: 06-09-1926 Today's Date: 01/28/2019    History of Present Illness Jonathon Sutton is a 86 y.o. male with medical history significant of Afib on Eliquis, HTN, CAD who presents to the emergency department with his wife who indicated the pt was not eating or drinking. Per chart, pt has been generally weak and not eating/drinking for the past 1-2 weeks. He tested positive for COVID-19 on 01/22/19. Pt lives with his 40 y.o. wife who is also COVID positive.  Pt presented to the ED on 01/20/19 for similar complaints, but left due to the long wait.   Clinical Impression   [name] was seen for OT evaluation this date. Pt unable to provide information regarding PLOF/home set up 2/2 cognitive and hearing status. This [place] spoke with pt wife, Lynard Sutton, over the phone to verify PLOF. Per pt and wife, pt does not use an adapted device for functional mobility. Pt wife indicates pt is very unsteady on his feet and has had 1 fall in the past 6 months where he slipped in his bathroom. Pt lives with his wife in a 1 level home with a basement and at least 2 steps to enter depending on the door. Prior to hospital admission, pt was independent with BADL tasks, but his wife reports they have both been feeling significantly weaker since their COVID-19 symptoms began. Currently pt demonstrates impairments in cognition, strength, activity tolerance, cardiopulmonary status safety and awareness requiring moderate assist +2 for bed mobility and moderate to max assist for BADL management at bed level or in supported sitting. Pt would benefit from skilled OT to address noted impairments and functional limitations (see below for any additional details) in order to maximize safety and independence while minimizing falls risk and caregiver burden. Upon hospital discharge, recommend STR to maximize pt safety and return to PLOF.       Follow Up Recommendations  SNF    Equipment Recommendations  3 in 1 bedside commode    Recommendations for Other Services       Precautions / Restrictions Precautions Precautions: Fall Precaution Comments: High Fall Restrictions Weight Bearing Restrictions: No      Mobility Bed Mobility Overal bed mobility: Needs Assistance Bed Mobility: Supine to Sit;Sit to Supine     Supine to sit: Mod assist;+2 for physical assistance Sit to supine: Mod assist;+2 for physical assistance   General bed mobility comments: Total assist for bed boost.  Transfers                 General transfer comment: Transfer deferred for pt safety. Pt noted to desat when seated EOB. Mobility limited.    Balance Overall balance assessment: Needs assistance Sitting-balance support: Feet unsupported;Bilateral upper extremity supported Sitting balance-Leahy Scale: Poor Sitting balance - Comments: Pt unable to maintain static sitting balance on stretcher bed w/o at least +1 support from therapist.                                   ADL either performed or assessed with clinical judgement   ADL Overall ADL's : Needs assistance/impaired                                       General ADL Comments: Pt limited by cognitive status, generalzed  weakness, and cardiopulmonary status. Requires mod A +2 to come to sitting at EOB. Limited ability to follow 1-step VCs. +2 assist for functional mobility. Mod A for LB ADL in sitting. Max A for bed level toileting.     Vision         Perception     Praxis      Pertinent Vitals/Pain Pain Assessment: No/denies pain     Hand Dominance     Extremity/Trunk Assessment Upper Extremity Assessment Upper Extremity Assessment: Generalized weakness(Pt noted to be generally tremulous in BUE/BLE. Per wife pt is "shaky" at baseline.)   Lower Extremity Assessment Lower Extremity Assessment: Generalized weakness        Communication Communication Communication: HOH   Cognition Arousal/Alertness: Lethargic Behavior During Therapy: Flat affect Overall Cognitive Status: No family/caregiver present to determine baseline cognitive functioning                                 General Comments: Difficult to assess as pt very hard of hearing. Able to state name, but does not give birthdate. States place as home. Unable to state date or situation. Per chart/family pt is "forgetful" at baseline.   General Comments  Pt on 2 L [place], spO2 monitored t/o session. Pt noted to desat to 80-82% while seated EOB, but rebounds to low 90's with return to supine.    Exercises     Shoulder Instructions      Home Living Family/patient expects to be discharged to:: Private residence Living Arrangements: Spouse/significant other Available Help at Discharge: Family;Available 24 hours/day Type of Home: House Home Access: Stairs to enter [employer] of Steps: 4-5 at front; 2 at side (no rails); 2-3 at back (with bilat rails)   Home Layout: Laundry or work area in basement;One level;Full bath on main level     Bathroom Shower/Tub: Teacher, early years/pre: Standard     Home Equipment: Walker - 4 wheels          Prior Functioning/Environment Level of Independence: Independent        Comments: Per wife, pt is fairly independent with ADL and functional mobility, but is unsteady on his feet. Does not very well or often. 1 fall in past 6 months, pt slipped in bathroom. Wife does driving, shopping, cooking, etc.        OT Problem List: Decreased strength;Decreased coordination;Cardiopulmonary status limiting activity;Decreased activity tolerance;Decreased safety awareness;Impaired balance (sitting and/or standing);Decreased knowledge of use of DME or AE      OT Treatment/Interventions: Therapeutic exercise;Therapeutic activities;DME and/or AE instruction;Patient/family  education;Balance training;Self-care/ADL training    OT Goals(Current goals can be found in the care plan section) Acute Rehab OT Goals Patient Stated Goal: To get stronger and Sutton home OT Goal Formulation: With patient/family Time For Goal Achievement: 02/11/19 Potential to Achieve Goals: Good ADL Goals Pt Will Perform Grooming: sitting;with supervision;with set-up Pt Will Transfer to Toilet: bedside commode;stand pivot transfer;with min assist(With LRAD PRN for improved safety and functional independence.) Pt Will Perform Toileting - Clothing Manipulation and hygiene: with adaptive equipment;sit to/from stand;with min assist(With LRAD PRN for improved safety and functional independence.)  OT Frequency: Min 1X/week   Barriers to D/C: Decreased caregiver support;Inaccessible home environment          Co-evaluation PT/OT/SLP Co-Evaluation/Treatment: Yes Reason for Co-Treatment: Necessary to address cognition/behavior during functional activity;For patient/therapist safety;To address functional/ADL transfers PT goals addressed during session: Mobility/safety with  mobility;Balance;Strengthening/ROM OT goals addressed during session: ADL's and self-care;Proper use of Adaptive equipment and DME      AM-PAC OT "6 Clicks" Daily Activity     Outcome Measure Help from another person eating meals?: A Little Help from another person taking care of personal grooming?: A Little Help from another person toileting, which includes using toliet, bedpan, or urinal?: Total Help from another person bathing (including washing, rinsing, drying)?: Total Help from another person to put on and taking off regular upper body clothing?: A Little Help from another person to put on and taking off regular lower body clothing?: A Lot 6 Click Score: 13   End of Session    Activity Tolerance: Patient limited by fatigue;Patient limited by lethargy Patient left: in bed;with call bell/phone within reach;Other  (comment)(with MD in room)  OT Visit Diagnosis: Other abnormalities of gait and mobility (R26.89);Muscle weakness (generalized) (M62.81)                Time: [id number] OT Time Calculation (min): 23 min Charges:  OT General Charges $OT Visit: 1 Visit OT Evaluation $OT Eval Moderate Complexity: 1 Mod  Shara Blazing, M.S., OTR/L Ascom: [phone] 01/28/19, 11:33 AM

## 2019-01-28 NOTE — Evaluation (Signed)
Physical Therapy Evaluation Patient Details Name: Jonathon Sutton MRN: [id number] DOB: 21-Dec-1926 Today's Date: 01/28/2019   History of Present Illness  Jonathon Sutton is a 86 y.o. male with medical history significant of Afib on Eliquis, HTN, CAD who presents to the emergency department with his wife who indicated the pt was not eating or drinking. Per chart, pt has been generally weak and not eating/drinking for the past 1-2 weeks. He tested positive for COVID-19 on 01/22/19. Pt lives with his 24 y.o. wife who is also COVID positive.  Pt presented to the ED on 01/20/19 for similar complaints, but left due to the long wait.  Clinical Impression  Pt is a 86 yo male seen in ED for PT/OT co-eval. Pt received resting in gurney and agreeable to PT. Pts home set up and PLOF received from OT who spoke with pts wife. Pt lives with wife in a one level home with at least two steps to enter. Pt does not normally ambulate with AD but wife reports he has been unsteady on feet lately and has had one fall in bathroom. pts wife reports both herself and the pt have felt weaker since they have gotten sick with Covid-19. Pt very HOH and difficult to fully assess cognition. Pt required mod-max A x2 to get EOB and return to supine. Pt required mod A to maintain sitting balance. Pt tolerated sitting EOB for a few minutes. O2 sats dropped to low 80s in sitting and quickly returned to 90%+ after return to supine. Pt on 2L [place] entire session. Pt required total A for scooting and repositioning in bed. Pt presents with decreased cognition, safety awareness, strength, balance, power, cardiopulmonary functioning and activity tolerance. Pt would benefit from further acute PT to improve noted deficits. Recommendation for STR following hospital discharge to improve functional mobility, increase independence, decrease fall risk and caregiver burden while allowing for return to PLOF.     Follow Up Recommendations SNF    Equipment  Recommendations  Rolling walker with 5" wheels;3in1 (PT)    Recommendations for Other Services       Precautions / Restrictions Precautions Precautions: Fall Precaution Comments: High Fall Restrictions Weight Bearing Restrictions: No      Mobility  Bed Mobility Overal bed mobility: Needs Assistance Bed Mobility: Supine to Sit;Sit to Supine     Supine to sit: Mod assist;+2 for physical assistance Sit to supine: Mod assist;+2 for physical assistance   General bed mobility comments: Total assist for bed boost, assist for LE advancement and trunk elevation to get EOB, total A for scooting up in bed  Transfers                 General transfer comment: Transfer deferred for pt safety. Pt noted to desat when seated EOB. Mobility limited.  Ambulation/Gait                Stairs            Wheelchair Mobility    Modified Rankin (Stroke Patients Only)       Balance Overall balance assessment: Needs assistance Sitting-balance support: Feet unsupported;Bilateral upper extremity supported Sitting balance-Leahy Scale: Poor Sitting balance - Comments: required support from therapist and BUE support to maintain sitting balance on gurney, of note because on gurney feet not supported  Pertinent Vitals/Pain Pain Assessment: No/denies pain    Home Living Family/patient expects to be discharged to:: Private residence Living Arrangements: Spouse/significant other Available Help at Discharge: Family;Available 24 hours/day Type of Home: House Home Access: Stairs to enter   [employer] of Steps: 4-5 at front; 2 at side (no rails); 2-3 at back (with bilat rails) Home Layout: Laundry or work area in basement;One level;Full bath on main level Home Equipment: Walker - 4 wheels      Prior Function Level of Independence: Independent         Comments: Per wife, pt is fairly independent with ADL and  functional mobility, but is unsteady on his feet. Does not very well or often. 1 fall in past 6 months, pt slipped in bathroom. Wife does driving, shopping, cooking, etc.     Hand Dominance        Extremity/Trunk Assessment   Upper Extremity Assessment Upper Extremity Assessment: Generalized weakness;Defer to OT evaluation    Lower Extremity Assessment Lower Extremity Assessment: Generalized weakness       Communication   Communication: HOH  Cognition Arousal/Alertness: Lethargic Behavior During Therapy: Flat affect Overall Cognitive Status: No family/caregiver present to determine baseline cognitive functioning                                 General Comments: Difficult to assess as pt very hard of hearing. Able to state name, but does not give birthdate. States place as home. Unable to state date or situation. Per chart/family pt is "forgetful" at baseline.      General Comments General comments (skin integrity, edema, etc.): Pt on 2 L [place], spO2 monitored t/o session. Pt noted to desat to 80-82% while seated EOB, but rebounds to low 90's with return to supine.    Exercises     Assessment/Plan    PT Assessment Patient needs continued PT services  PT Problem List Decreased strength;Decreased mobility;Decreased safety awareness;Decreased range of motion;Decreased cognition;Decreased activity tolerance;Decreased balance;Cardiopulmonary status limiting activity;Decreased knowledge of use of DME       PT Treatment Interventions DME instruction;Therapeutic exercise;Gait training;Balance training;Stair training;Neuromuscular re-education;Functional mobility training;Cognitive remediation;Therapeutic activities;Patient/family education    PT Goals (Current goals can be found in the Care Plan section)  Acute Rehab PT Goals Patient Stated Goal: return home PT Goal Formulation: With patient Time For Goal Achievement: 02/11/19 Potential to Achieve Goals: Fair     Frequency Min 2X/week   Barriers to discharge        Co-evaluation PT/OT/SLP Co-Evaluation/Treatment: Yes Reason for Co-Treatment: Necessary to address cognition/behavior during functional activity;To address functional/ADL transfers;For patient/therapist safety PT goals addressed during session: Mobility/safety with mobility;Balance;Strengthening/ROM OT goals addressed during session: ADL's and self-care;Proper use of Adaptive equipment and DME       AM-PAC PT "6 Clicks" Mobility  Outcome Measure Help needed turning from your back to your side while in a flat bed without using bedrails?: A Little Help needed moving from lying on your back to sitting on the side of a flat bed without using bedrails?: A Little Help needed moving to and from a bed to a chair (including a wheelchair)?: A Lot Help needed standing up from a chair using your arms (e.g., wheelchair or bedside chair)?: A Lot Help needed to walk in hospital room?: Total Help needed climbing 3-5 steps with a railing? : Total 6 Click Score: 12    End of Session Equipment Utilized During  Treatment: Oxygen Activity Tolerance: Patient tolerated treatment well Patient left: in bed;with call bell/phone within reach Nurse Communication: Mobility status PT Visit Diagnosis: Muscle weakness (generalized) (M62.81);Difficulty in walking, not elsewhere classified (R26.2);Unsteadiness on feet (R26.81)    Time: [id number] PT Time Calculation (min) (ACUTE ONLY): 23 min   Charges:   PT Evaluation $PT Eval Moderate Complexity: 1 Mod         Goldye Tourangeau PT, DPT 12:48 PM,01/28/19 [phone]   Eshani Maestre Dru[place]illa Chalet 01/28/2019, 12:42 PM

## 2019-01-28 NOTE — ED Notes (Signed)
PT messing with cardiac wires. Fixed pt's blankets so wires no longer easily visible and encouraged rest.

## 2019-01-28 NOTE — ED Notes (Signed)
Spoke with pts dtr, Pam, and updated her on pt. Informed her that pt has a bed and will be going to the floor shortly.

## 2019-01-28 NOTE — ED Notes (Signed)
New, dry brief applied to pt. PT able to assist in brief change by lifting hips.

## 2019-01-28 NOTE — ED Notes (Signed)
Pt urinated in brief. Brief changed and is now clean/dry. Pt comfortable and in NAD. Will continue to monitor.

## 2019-01-28 NOTE — ED Notes (Signed)
Report to [facility] on 2A

## 2019-01-28 NOTE — ED Notes (Signed)
PT and OT in with pt at this time.

## 2019-01-28 NOTE — Plan of Care (Signed)
  Problem: Safety: Goal: Ability to remain free from injury will improve Outcome: Progressing   

## 2019-01-28 NOTE — Progress Notes (Signed)
Patient daughter Jonathon Sutton updated.

## 2019-01-28 NOTE — Progress Notes (Signed)
Remdesivir - Pharmacy Brief Note   O:  ALT: 15 CXR: Patchy bilateral interstitial and alveolar airspace disease as can be seen with multilobar pneumonia including atypical viral pneumonia. SpO2: 92% on 2L   A/P:  Remdesivir 200 mg IVPB once followed by 100 mg IVPB daily x 4 days.    Jonathon Sutton 01/28/2019 4:20 PM

## 2019-01-28 NOTE — TOC Initial Note (Signed)
Transition of Care [facility]) - Initial/Assessment Note    Patient Details  Name: Jonathon Sutton MRN: [id number] Date of Birth: [birth year]  [facility] of [facility]) CM/SW Contact:    Eileen Stanford, LCSW Phone Number: 01/28/2019, 1:54 PM  Clinical Narrative: Pt is COVID+. CSW spoke with pt's spouse via telephone. CSW explained the need for pt to go to snf--pt's wife understanding. CSW explained not all facilities are excepting COVID 19 patients and that CSW would initiate search and update pt's wife. Pt's wife agreeable.                 Expected Discharge Plan: Skilled Nursing Facility Barriers to Discharge: Continued Medical Work up   Patient Goals and CMS Choice Patient states their goals for this hospitalization and ongoing recovery are:: to get better CMS Medicare.gov Compare Post Acute Care list provided to:: Patient Represenative (must comment) Choice offered to / list presented to : Spouse  Expected Discharge Plan and Services Expected Discharge Plan: [place] In-house Referral: NA   Post Acute Care Choice: [place] Living arrangements for the past 2 months: Single Family Home                                      Prior Living Arrangements/Services Living arrangements for the past 2 months: Single Family Home Lives with:: Spouse Patient language and need for interpreter reviewed:: Yes Do you feel safe going back to the place where you live?: Yes      Need for Family Participation in Patient Care: Yes (Comment) Care giver support system in place?: Yes (comment)   Criminal Activity/Legal Involvement Pertinent to Current Situation/Hospitalization: No - Comment as needed  Activities of Daily Living      Permission Sought/Granted Permission sought to share information with : Family Supports    Share Information with NAME: Enid Derry     Permission granted to share info w Relationship: spouse     Emotional  Assessment Appearance:: Appears stated age Attitude/Demeanor/Rapport: Unable to Assess Affect (typically observed): Unable to Assess Orientation: : Oriented to Situation, Oriented to  Time, Oriented to Place, Oriented to Self Alcohol / Substance Use: Not Applicable Psych Involvement: No (comment)  Admission diagnosis:  Dehydration [E86.0] Weakness generalized [R53.1] Weakness [R53.1] COVID-19 [U07.1] Patient Active Problem List   Diagnosis Date Noted  . Weakness generalized 01/27/2019  . COVID-19 virus infection 01/27/2019  . At risk for dehydration due to poor fluid intake 01/27/2019  . Poor fluid intake 01/27/2019  . Confusion 01/27/2019  . Cough with exposure to COVID-19 virus 01/27/2019  . Hypothermia 01/27/2019  . Atrial fibrillation ([place])   . CAD (coronary artery disease)    PCP:  Cletis Athens, MD Pharmacy:   [place], [place] - [address] [place] [address] Phone: [phone] Fax: [phone]     Social Determinants of Health (SDOH) Interventions    Readmission Risk Interventions No flowsheet data found.

## 2019-01-28 NOTE — Progress Notes (Signed)
Apixaban Dosing adjustment for Atrial Fibrillation  86 yo M with Scr 1.53  Wt 68 kg  Patient meets parameters for dose adjustment in Afib dosing: > 15 yo and  Scr >/= 1.5  Will adjust apixaban to 2.5 mg PO BID   Chinita Greenland PharmD Clinical Pharmacist 01/28/2019

## 2019-01-28 NOTE — ED Notes (Signed)
Pt is dry at this time

## 2019-01-28 NOTE — ED Notes (Signed)
Pt brief changed and is clean/dry at this time. Pt comfortable in bed, NAD noted. VSS. Will continue to monitor.

## 2019-01-29 LAB — GLUCOSE, CAPILLARY: Glucose-Capillary: 144 mg/dL — ABNORMAL HIGH (ref 70–99)

## 2019-01-29 LAB — CBC
HCT: 45.2 % (ref 39.0–52.0)
Hemoglobin: 15.1 g/dL (ref 13.0–17.0)
MCH: 29.7 pg (ref 26.0–34.0)
MCHC: 33.4 g/dL (ref 30.0–36.0)
MCV: 89 fL (ref 80.0–100.0)
Platelets: 116 10*3/uL — ABNORMAL LOW (ref 150–400)
RBC: 5.08 MIL/uL (ref 4.22–5.81)
RDW: 13 % (ref 11.5–15.5)
WBC: 2.9 10*3/uL — ABNORMAL LOW (ref 4.0–10.5)
nRBC: 0 % (ref 0.0–0.2)

## 2019-01-29 LAB — MAGNESIUM: Magnesium: 2.3 mg/dL (ref 1.7–2.4)

## 2019-01-29 LAB — BASIC METABOLIC PANEL
Anion gap: 11 (ref 5–15)
BUN: 39 mg/dL — ABNORMAL HIGH (ref 8–23)
CO2: 26 mmol/L (ref 22–32)
Calcium: 8.3 mg/dL — ABNORMAL LOW (ref 8.9–10.3)
Chloride: 105 mmol/L (ref 98–111)
Creatinine, Ser: 1.34 mg/dL — ABNORMAL HIGH (ref 0.61–1.24)
GFR calc Af Amer: 54 mL/min — ABNORMAL LOW (ref >60)
GFR calc non Af Amer: 46 mL/min — ABNORMAL LOW (ref >60)
Glucose, Bld: 139 mg/dL — ABNORMAL HIGH (ref 70–99)
Potassium: 4 mmol/L (ref 3.5–5.1)
Sodium: 142 mmol/L (ref 135–145)

## 2019-01-29 MED ORDER — APIXABAN 5 MG PO TABS
5.0000 mg | ORAL_TABLET | Freq: Two times a day (BID) | ORAL | Status: DC
  Administered 2019-01-29 – 2019-02-04 (×8): 5 mg via ORAL
  Filled 2019-01-29 (×12): qty 1

## 2019-01-29 NOTE — Progress Notes (Signed)
PROGRESS NOTE    CROSS GOLDBECK  [id number] DOB: [birth year] DOA: 01/27/2019 PCP: Cletis Athens, MD    Assessment & Plan:   Principal Problem:   Poor fluid intake Active Problems:   Weakness generalized   Atrial fibrillation (HCC)   CAD (coronary artery disease)   COVID-19 virus infection   At risk for dehydration due to poor fluid intake   Confusion   Cough with exposure to COVID-19 virus   Hypothermia   Pneumonia due to COVID-19 virus    Jonathon Sutton is a 86 y.o. male with medical history significant of Afib on Eliquis, HTN, CAD who was brought to the ED by his wife for eating eating or drinking.    # Acute hypoxic respiratory failure 2/2 COVID-19 infection --dx around 01/22/19, positive for cough productive of sputum, change in taste sensation, poor PO intake, confusion.  CXR showed "Patchy bilateral interstitial and alveolar airspace disease".  Procal neg. --While working with PT/OT, "Pt tolerated sitting EOB for a few minutes. O2 sats dropped to low 80s in sitting and quickly returned to 90%+ after return to supine. Pt on 2L [place] entire session."   PLAN: --continue Remdesivir (1/8--) and PO decadron --continue suppl O2, wean as able --Zinc and vit C  # Poor PO intake # Confusion # Weakness --Didn't eat or drink well for at least a week reportedly due to change of taste.  Likely an effect from COVID infection.  --s/p S@100  for 10 hours for hydration  PLAN: --PT/OT rec SNF rehab  # Afib on home Eliquis --variable rate, mostly controlled PLAN: --continue home atenolol and Eliquis  # Hx of HTN --BP currently low normal PLAN: --continue home atenolol --hold home Isosorbide mononitrate and Maxzide  # Hx of CAD --continue home statin     DVT prophylaxis: [id number] Code Status: Full code  Disposition Plan: SNF.  Pt is not safe for discharge home.   Subjective and Interval History:  Can not obtain hx as pt can't hear.  No fever,  N/V/D.   Objective: Vitals:   01/29/19 0133 01/29/19 0509 01/29/19 0552 01/29/19 0828  BP:  101/88 125/74 (!) 144/75  Pulse: (!) 47 (!) 49 (!) 48 (!) 48  Resp:    19  Temp:   97.7 F (36.5 C)   TempSrc:   Axillary   SpO2: 95% 95% 97% 94%  Weight:  63.8 kg    Height:        Intake/Output Summary (Last 24 hours) at 01/29/2019 1501 Last data filed at 01/29/2019 0514 Gross per 24 hour  Intake 240 ml  Output 200 ml  Net 40 ml   Filed Weights   01/27/19 1356 01/28/19 1524 01/29/19 0509  Weight: 68 kg 68.6 kg 63.8 kg    Examination:   Constitutional: NAD, alert, not oriented, not quite coherent HEENT: conjunctivae and lids normal, EOMI, deaf CV: RRR no M,R,G. Distal pulses +2.  No cyanosis.   RESP: CTA B/L, normal respiratory effort, on 2L  GI: +BS, NTND Extremities: No effusions, edema, or tenderness in BLE SKIN: warm, dry and intact Neuro: II - XII grossly intact.  Sensation intact   Data Reviewed: I have personally reviewed following labs and imaging studies  CBC: Recent Labs  Lab 01/27/19 1350 01/28/19 0551 01/29/19 0653  WBC 3.9* 3.7* 2.9*  NEUTROABS 3.2  --   --   HGB 16.4 15.5 15.1  HCT 48.1 45.5 45.2  MCV 87.9 88.3 89.0  PLT 105* 113* 116*  Basic Metabolic Panel: Recent Labs  Lab 01/27/19 1432 01/28/19 0551 01/29/19 0653  NA 138 140 142  K 4.1 3.1* 4.0  CL 102 104 105  CO2 21* 23 26  GLUCOSE 114* 97 139*  BUN 46* 36* 39*  CREATININE 1.59* 1.53* 1.34*  CALCIUM 8.2* 8.1* 8.3*  MG  --  1.9 2.3   GFR: Estimated Creatinine Clearance: 33.1 mL/min (A) (by C-G formula based on SCr of 1.34 mg/dL (H)). Liver Function Tests: Recent Labs  Lab 01/27/19 1432  AST 45*  ALT 15  ALKPHOS 19*  BILITOT 2.7*  PROT 6.5  ALBUMIN 2.8*   No results for input(s): LIPASE, AMYLASE in the last 168 hours. No results for input(s): AMMONIA in the last 168 hours. Coagulation Profile: Recent Labs  Lab 01/27/19 1350  INR 2.3*   Cardiac Enzymes: No results for  input(s): CKTOTAL, CKMB, CKMBINDEX, TROPONINI in the last 168 hours. BNP (last 3 results) No results for input(s): PROBNP in the last 8760 hours. HbA1C: No results for input(s): HGBA1C in the last 72 hours. CBG: No results for input(s): GLUCAP in the last 168 hours. Lipid Profile: No results for input(s): CHOL, HDL, LDLCALC, TRIG, CHOLHDL, LDLDIRECT in the last 72 hours. Thyroid Function Tests: No results for input(s): TSH, T4TOTAL, FREET4, T3FREE, THYROIDAB in the last 72 hours. Anemia Panel: Recent Labs    01/27/19 1432  FERRITIN 2,194*   Sepsis Labs: Recent Labs  Lab 01/27/19 1342 01/27/19 1432 01/27/19 1601  PROCALCITON  --  <0.10  --   LATICACIDVEN 2.2*  --  1.9    Recent Results (from the past 240 hour(s))  Blood Culture (routine x 2)     Status: None (Preliminary result)   Collection Time: 01/27/19  1:50 PM   Specimen: BLOOD  Result Value Ref Range Status   Specimen Description BLOOD LEFT ANTECUBITAL  Final   Special Requests   Final    BOTTLES DRAWN AEROBIC AND ANAEROBIC Blood Culture adequate volume   Culture   Final    NO GROWTH 2 DAYS Performed at [facility], [address] [place], [place] [address]    Report Status PENDING  Incomplete  Blood Culture (routine x 2)     Status: None (Preliminary result)   Collection Time: 01/27/19  1:50 PM   Specimen: BLOOD  Result Value Ref Range Status   Specimen Description BLOOD BLOOD RIGHT FOREARM  Final   Special Requests   Final    BOTTLES DRAWN AEROBIC AND ANAEROBIC Blood Culture adequate volume   Culture   Final    NO GROWTH 2 DAYS Performed at [facility], [address] [place], [place] [address]    Report Status PENDING  Incomplete      Radiology Studies: No results found.   Scheduled Meds: . apixaban  5 mg Oral BID  . vitamin C  500 mg Oral Daily  . atenolol  25 mg Oral Daily  . dexamethasone  6 mg Oral Daily  . pravastatin  20 mg Oral q1800  . zinc sulfate  220 mg Oral  Daily   Continuous Infusions: . remdesivir 200 mg in sodium chloride 0.9% 250 mL IVPB     Followed by  . remdesivir 100 mg in NS 100 mL 100 mg (01/29/19 0900)     LOS: 1 day     Enzo Bi, MD [facility] If 7PM-7AM, please contact night-coverage 01/29/2019, 3:01 PM

## 2019-01-29 NOTE — Progress Notes (Addendum)
Patients upper dentures and glasses dropped off by family member. Patient is wearing glasses and reading newspaper and appears to be resting comfortably in bed. Spoke to patients daughter and gave update. Daughter and spouse are aware of visitor policy, and this RN used ipad on unit to allow patient to facetime with daughter.

## 2019-01-30 LAB — COMPREHENSIVE METABOLIC PANEL
ALT: 21 U/L (ref 0–44)
AST: 47 U/L — ABNORMAL HIGH (ref 15–41)
Albumin: 2.6 g/dL — ABNORMAL LOW (ref 3.5–5.0)
Alkaline Phosphatase: 19 U/L — ABNORMAL LOW (ref 38–126)
Anion gap: 12 (ref 5–15)
BUN: 46 mg/dL — ABNORMAL HIGH (ref 8–23)
CO2: 23 mmol/L (ref 22–32)
Calcium: 8.5 mg/dL — ABNORMAL LOW (ref 8.9–10.3)
Chloride: 107 mmol/L (ref 98–111)
Creatinine, Ser: 1.14 mg/dL (ref 0.61–1.24)
GFR calc Af Amer: >60 mL/min (ref >60)
GFR calc non Af Amer: 56 mL/min — ABNORMAL LOW (ref >60)
Glucose, Bld: 163 mg/dL — ABNORMAL HIGH (ref 70–99)
Potassium: 4.1 mmol/L (ref 3.5–5.1)
Sodium: 142 mmol/L (ref 135–145)
Total Bilirubin: 1.8 mg/dL — ABNORMAL HIGH (ref 0.3–1.2)
Total Protein: 5.9 g/dL — ABNORMAL LOW (ref 6.5–8.1)

## 2019-01-30 LAB — CBC
HCT: 48.7 % (ref 39.0–52.0)
Hemoglobin: 16.3 g/dL (ref 13.0–17.0)
MCH: 29.5 pg (ref 26.0–34.0)
MCHC: 33.5 g/dL (ref 30.0–36.0)
MCV: 88.2 fL (ref 80.0–100.0)
Platelets: 150 10*3/uL (ref 150–400)
RBC: 5.52 MIL/uL (ref 4.22–5.81)
RDW: 13.2 % (ref 11.5–15.5)
WBC: 6.9 10*3/uL (ref 4.0–10.5)
nRBC: 0 % (ref 0.0–0.2)

## 2019-01-30 LAB — MAGNESIUM: Magnesium: 2.3 mg/dL (ref 1.7–2.4)

## 2019-01-30 LAB — PROTIME-INR
INR: 2.2 — ABNORMAL HIGH (ref 0.8–1.2)
Prothrombin Time: 24.1 seconds — ABNORMAL HIGH (ref 11.4–15.2)

## 2019-01-30 NOTE — Progress Notes (Signed)
CCMD called and reported that pt had an episode of idioventricular rhythm with a HR of 40. On assessment pt was not on any distress. VSS except oxygen at 87% on RA. Pt was placed on oxygen and now sating at 94 %. Notify prime. Will continue to monitor.

## 2019-01-30 NOTE — Plan of Care (Signed)
  Problem: Safety: Goal: Ability to remain free from injury will improve 01/30/2019 0320 by Liliane Channel, RN Outcome: Progressing 01/30/2019 0319 by Liliane Channel, RN Outcome: Progressing   Problem: Respiratory: Goal: Will maintain a patent airway Outcome: Progressing

## 2019-01-30 NOTE — Progress Notes (Signed)
PROGRESS NOTE    Jonathon Sutton  [id number] DOB: [birth year] DOA: 01/27/2019 PCP: Cletis Athens, MD    Assessment & Plan:   Principal Problem:   Poor fluid intake Active Problems:   Weakness generalized   Atrial fibrillation (HCC)   CAD (coronary artery disease)   COVID-19 virus infection   At risk for dehydration due to poor fluid intake   Confusion   Cough with exposure to COVID-19 virus   Hypothermia   Pneumonia due to COVID-19 virus    Jonathon Sutton is a 86 y.o. male with medical history significant of Afib on Eliquis, HTN, CAD who was brought to the ED by his wife for eating eating or drinking.    # Acute hypoxic respiratory failure 2/2 COVID-19 infection --dx around 01/22/19, positive for cough productive of sputum, change in taste sensation, poor PO intake, confusion.  CXR showed "Patchy bilateral interstitial and alveolar airspace disease".  Procal neg. --While working with PT/OT, "Pt tolerated sitting EOB for a few minutes. O2 sats dropped to low 80s in sitting and quickly returned to 90%+ after return to supine. Pt on 2L [place] entire session."   PLAN: --continue Remdesivir (1/8--) and PO decadron --continue suppl O2, wean as able --Zinc and vit C  # Poor PO intake # Confusion # Weakness --Didn't eat or drink well for at least a week reportedly due to change of taste.  Likely an effect from COVID infection.  --s/p S@100  for 10 hours for hydration  PLAN: --PT/OT rec SNF rehab  # Afib on home Eliquis --variable rate, mostly controlled PLAN: --continue home atenolol and Eliquis  # Hx of HTN --BP currently low normal PLAN: --continue home atenolol --hold home Isosorbide mononitrate and Maxzide  # Hx of CAD --continue home statin    DVT prophylaxis: [id number] Code Status: Full code  Disposition Plan: SNF.  Pt is not safe for discharge home. Daughter updated on the phone today.   Subjective and Interval History:  Can not obtain hx as pt  can't hear and seemed confused.  Pt started pulling out IV, lines.  Still not wanting to eat because of foods tasting bad.  No fever, N/V/D.   Objective: Vitals:   01/30/19 0550 01/30/19 0553 01/30/19 0744 01/30/19 1540  BP: 105/90  137/74 139/68  Pulse: (!) 53  (!) 51 (!) 55  Resp:   15 16  Temp:   (!) 97.5 F (36.4 C) 97.6 F (36.4 C)  TempSrc:   Oral Oral  SpO2: 91% 94% 96% 96%  Weight:      Height:        Intake/Output Summary (Last 24 hours) at 01/30/2019 1816 Last data filed at 01/30/2019 1300 Gross per 24 hour  Intake --  Output 802 ml  Net -802 ml   Filed Weights   01/27/19 1356 01/28/19 1524 01/29/19 0509  Weight: 68 kg 68.6 kg 63.8 kg    Examination:   Constitutional: NAD, alert, not oriented, not quite coherent, sitting up in chair HEENT: conjunctivae and lids normal, EOMI, deaf, glasses on today CV: RRR no M,R,G. Distal pulses +2.  No cyanosis.   RESP: CTA B/L, normal respiratory effort, on 1L  GI: +BS, NTND Extremities: No effusions, edema, or tenderness in BLE SKIN: warm, dry and intact Neuro: II - XII grossly intact.  Sensation intact   Data Reviewed: I have personally reviewed following labs and imaging studies  CBC: Recent Labs  Lab 01/27/19 1350 01/28/19 0551 01/29/19 0653 01/30/19 0502  WBC 3.9* 3.7* 2.9* 6.9  NEUTROABS 3.2  --   --   --   HGB 16.4 15.5 15.1 16.3  HCT 48.1 45.5 45.2 48.7  MCV 87.9 88.3 89.0 88.2  PLT 105* 113* 116* [id number]   Basic Metabolic Panel: Recent Labs  Lab 01/27/19 1432 01/28/19 0551 01/29/19 0653 01/30/19 0502  NA 138 140 142 142  K 4.1 3.1* 4.0 4.1  CL 102 104 105 107  CO2 21* 23 26 23   GLUCOSE 114* 97 139* 163*  BUN 46* 36* 39* 46*  CREATININE 1.59* 1.53* 1.34* 1.14  CALCIUM 8.2* 8.1* 8.3* 8.5*  MG  --  1.9 2.3 2.3   GFR: Estimated Creatinine Clearance: 38.9 mL/min (by C-G formula based on SCr of 1.14 mg/dL). Liver Function Tests: Recent Labs  Lab 01/27/19 1432 01/30/19 0502  AST 45* 47*  ALT  15 21  ALKPHOS 19* 19*  BILITOT 2.7* 1.8*  PROT 6.5 5.9*  ALBUMIN 2.8* 2.6*   No results for input(s): LIPASE, AMYLASE in the last 168 hours. No results for input(s): AMMONIA in the last 168 hours. Coagulation Profile: Recent Labs  Lab 01/27/19 1350 01/30/19 0502  INR 2.3* 2.2*   Cardiac Enzymes: No results for input(s): CKTOTAL, CKMB, CKMBINDEX, TROPONINI in the last 168 hours. BNP (last 3 results) No results for input(s): PROBNP in the last 8760 hours. HbA1C: No results for input(s): HGBA1C in the last 72 hours. CBG: Recent Labs  Lab 01/29/19 1630  GLUCAP 144*   Lipid Profile: No results for input(s): CHOL, HDL, LDLCALC, TRIG, CHOLHDL, LDLDIRECT in the last 72 hours. Thyroid Function Tests: No results for input(s): TSH, T4TOTAL, FREET4, T3FREE, THYROIDAB in the last 72 hours. Anemia Panel: No results for input(s): VITAMINB12, FOLATE, FERRITIN, TIBC, IRON, RETICCTPCT in the last 72 hours. Sepsis Labs: Recent Labs  Lab 01/27/19 1342 01/27/19 1432 01/27/19 1601  PROCALCITON  --  <0.10  --   LATICACIDVEN 2.2*  --  1.9    Recent Results (from the past 240 hour(s))  Blood Culture (routine x 2)     Status: None (Preliminary result)   Collection Time: 01/27/19  1:50 PM   Specimen: BLOOD  Result Value Ref Range Status   Specimen Description BLOOD LEFT ANTECUBITAL  Final   Special Requests   Final    BOTTLES DRAWN AEROBIC AND ANAEROBIC Blood Culture adequate volume   Culture   Final    NO GROWTH 3 DAYS Performed at [facility], [address] [place], [place] [address]    Report Status PENDING  Incomplete  Blood Culture (routine x 2)     Status: None (Preliminary result)   Collection Time: 01/27/19  1:50 PM   Specimen: BLOOD  Result Value Ref Range Status   Specimen Description BLOOD BLOOD RIGHT FOREARM  Final   Special Requests   Final    BOTTLES DRAWN AEROBIC AND ANAEROBIC Blood Culture adequate volume   Culture   Final    NO GROWTH 3  DAYS Performed at [facility] [address], [place], [place] [address]    Report Status PENDING  Incomplete      Radiology Studies: No results found.   Scheduled Meds: . apixaban  5 mg Oral BID  . vitamin C  500 mg Oral Daily  . atenolol  25 mg Oral Daily  . dexamethasone  6 mg Oral Daily  . pravastatin  20 mg Oral q1800  . zinc sulfate  220 mg Oral Daily  Continuous Infusions: . remdesivir 200 mg in sodium chloride 0.9% 250 mL IVPB     Followed by  . remdesivir 100 mg in NS 100 mL 100 mg (01/30/19 0955)     LOS: 2 days     Enzo Bi, MD [facility] If 7PM-7AM, please contact night-coverage 01/30/2019, 6:16 PM

## 2019-01-30 NOTE — Progress Notes (Addendum)
Patient pulled out both IV's this morning. Appears to be very confused today and have poor safety judgement. Mitts placed on patients hands for safety and to prevent patient from pulling out IV's and heart monitor leads. Floor mats have been placed on floor next to bed. Patient has had poor PO intake today. This RN spoke to daughter on the phone and gave update. Will assist patient in facetiming with daughter this evening using unit ipad.

## 2019-01-31 MED ORDER — METOPROLOL TARTRATE 5 MG/5ML IV SOLN
5.0000 mg | Freq: Once | INTRAVENOUS | Status: AC
  Administered 2019-01-31: 5 mg via INTRAVENOUS
  Filled 2019-01-31: qty 5

## 2019-01-31 MED ORDER — DILTIAZEM HCL 25 MG/5ML IV SOLN
10.0000 mg | Freq: Once | INTRAVENOUS | Status: AC
  Administered 2019-01-31: 11:00:00 10 mg via INTRAVENOUS

## 2019-01-31 MED ORDER — DILTIAZEM HCL-DEXTROSE 125-5 MG/125ML-% IV SOLN (PREMIX)
5.0000 mg/h | INTRAVENOUS | Status: DC
  Administered 2019-01-31: 19:00:00 5 mg/h via INTRAVENOUS
  Filled 2019-01-31: qty 125

## 2019-01-31 MED ORDER — AMIODARONE IV BOLUS ONLY 150 MG/100ML
150.0000 mg | Freq: Once | INTRAVENOUS | Status: AC
  Administered 2019-01-31: 23:00:00 150 mg via INTRAVENOUS
  Filled 2019-01-31: qty 100

## 2019-01-31 MED ORDER — DILTIAZEM HCL 25 MG/5ML IV SOLN
10.0000 mg | Freq: Once | INTRAVENOUS | Status: AC
  Administered 2019-01-31: 08:00:00 10 mg via INTRAVENOUS
  Filled 2019-01-31: qty 5

## 2019-01-31 MED ORDER — SODIUM CHLORIDE 0.9 % IV SOLN: INTRAVENOUS | Status: AC

## 2019-01-31 NOTE — Progress Notes (Signed)
Rn initiated cardizem drip at 5mg  at 88. Pre blood pressure 103/80. Rechecked blood pressure after 2 mins and noted BP to be systolic in the [id number] (88) . Pt asymptomatic.cardizem paused and report given to on coming nurse about blood pressure. Pt continues to have NS 0.9% @ 100.Marland Kitchen

## 2019-01-31 NOTE — Progress Notes (Addendum)
CCMD called and reported that pt converted from SR to Afib HR 136-140. On assessment pt was asleep and not on any distress. Notify prime and incoming shift. Will continue to monitor.  Update 0730: Dr. Billie Ruddy called and states will review chart and come see pt. Incoming shift made aware.

## 2019-01-31 NOTE — Progress Notes (Signed)
PROGRESS NOTE    Jonathon Sutton  [id number] DOB: June 29, 1926 DOA: 01/27/2019 PCP: Cletis Athens, MD    Assessment & Plan:   Principal Problem:   Poor fluid intake Active Problems:   Weakness generalized   Atrial fibrillation (HCC)   CAD (coronary artery disease)   COVID-19 virus infection   At risk for dehydration due to poor fluid intake   Confusion   Cough with exposure to COVID-19 virus   Hypothermia   Pneumonia due to COVID-19 virus    Jonathon Sutton is a 86 y.o. male with medical history significant of Afib on Eliquis, HTN, CAD who was brought to the ED by his wife for eating eating or drinking.    # Acute hypoxic respiratory failure 2/2 COVID-19 infection --dx around 01/22/19, positive for cough productive of sputum, change in taste sensation, poor PO intake, confusion.  CXR showed "Patchy bilateral interstitial and alveolar airspace disease".  Procal neg. --While working with PT/OT, "Pt tolerated sitting EOB for a few minutes. O2 sats dropped to low 80s in sitting and quickly returned to 90%+ after return to supine. Pt on 2L [place] entire session."   PLAN: --continue Remdesivir (1/8--) and PO decadron --continue suppl O2, wean as able --Zinc and vit C  # Poor PO intake # Confusion # Weakness --Didn't eat or drink well for at least a week reportedly due to change of taste.  Likely an effect from COVID infection.  --s/p S@100  for 10 hours for hydration  PLAN: --PT/OT rec SNF rehab --More gentle IVF hydration today since pt is not having good PO intake  # Afib on home Eliquis # Afib w RVR --variable rate.  Home atenolol was held for the past 2 days due to HR in 40's.  Today, pt went into Afib w RVR. PLAN: --continue home atenolol and Eliquis --IV dilt f/b gtt titratable by nursing  # Hx of HTN --BP currently low normal PLAN: --continue home atenolol --hold home Isosorbide mononitrate and Maxzide  # Hx of CAD --continue home statin    DVT  prophylaxis: [id number] Code Status: Full code  Disposition Plan: SNF.  Pt is not safe for discharge home.   Subjective and Interval History:  Can't obtain ROS.  Pt can't hear and doesn't answer questions.  No fever, N/V/D, increased swelling.  Pt went into Afib RVR today.  Gave IV dilt f/b gtt.   Objective: Vitals:   01/31/19 1044 01/31/19 1455 01/31/19 1531 01/31/19 1606  BP: 109/84 95/71 105/76 103/88  Pulse: (!) 101 (!) 111 84 81  Resp:    18  Temp:    97.8 F (36.6 C)  TempSrc:    Oral  SpO2: 95%   94%  Weight:      Height:        Intake/Output Summary (Last 24 hours) at 01/31/2019 1717 Last data filed at 01/31/2019 1054 Gross per 24 hour  Intake 120 ml  Output 425 ml  Net -305 ml   Filed Weights   01/28/19 1524 01/29/19 0509 01/31/19 0530  Weight: 68.6 kg 63.8 kg 62.4 kg    Examination:   Constitutional: NAD, alert, not oriented, doesn't answer questions HEENT: conjunctivae and lids normal, EOMI, deaf CV: RRR no M,R,G. Distal pulses +2.  No cyanosis.   RESP: CTA B/L, normal respiratory effort, on RA  GI: +BS, NTND Extremities: No effusions, edema, or tenderness in BLE SKIN: warm, dry and intact Neuro: II - XII grossly intact.  Sensation intact  Data Reviewed: I have personally reviewed following labs and imaging studies  CBC: Recent Labs  Lab 01/27/19 1350 01/28/19 0551 01/29/19 0653 01/30/19 0502  WBC 3.9* 3.7* 2.9* 6.9  NEUTROABS 3.2  --   --   --   HGB 16.4 15.5 15.1 16.3  HCT 48.1 45.5 45.2 48.7  MCV 87.9 88.3 89.0 88.2  PLT 105* 113* 116* [id number]   Basic Metabolic Panel: Recent Labs  Lab 01/27/19 1432 01/28/19 0551 01/29/19 0653 01/30/19 0502  NA 138 140 142 142  K 4.1 3.1* 4.0 4.1  CL 102 104 105 107  CO2 21* 23 26 23   GLUCOSE 114* 97 139* 163*  BUN 46* 36* 39* 46*  CREATININE 1.59* 1.53* 1.34* 1.14  CALCIUM 8.2* 8.1* 8.3* 8.5*  MG  --  1.9 2.3 2.3   GFR: Estimated Creatinine Clearance: 38 mL/min (by C-G formula based on SCr of  1.14 mg/dL). Liver Function Tests: Recent Labs  Lab 01/27/19 1432 01/30/19 0502  AST 45* 47*  ALT 15 21  ALKPHOS 19* 19*  BILITOT 2.7* 1.8*  PROT 6.5 5.9*  ALBUMIN 2.8* 2.6*   No results for input(s): LIPASE, AMYLASE in the last 168 hours. No results for input(s): AMMONIA in the last 168 hours. Coagulation Profile: Recent Labs  Lab 01/27/19 1350 01/30/19 0502  INR 2.3* 2.2*   Cardiac Enzymes: No results for input(s): CKTOTAL, CKMB, CKMBINDEX, TROPONINI in the last 168 hours. BNP (last 3 results) No results for input(s): PROBNP in the last 8760 hours. HbA1C: No results for input(s): HGBA1C in the last 72 hours. CBG: Recent Labs  Lab 01/29/19 1630  GLUCAP 144*   Lipid Profile: No results for input(s): CHOL, HDL, LDLCALC, TRIG, CHOLHDL, LDLDIRECT in the last 72 hours. Thyroid Function Tests: No results for input(s): TSH, T4TOTAL, FREET4, T3FREE, THYROIDAB in the last 72 hours. Anemia Panel: No results for input(s): VITAMINB12, FOLATE, FERRITIN, TIBC, IRON, RETICCTPCT in the last 72 hours. Sepsis Labs: Recent Labs  Lab 01/27/19 1342 01/27/19 1432 01/27/19 1601  PROCALCITON  --  <0.10  --   LATICACIDVEN 2.2*  --  1.9    Recent Results (from the past 240 hour(s))  Blood Culture (routine x 2)     Status: None (Preliminary result)   Collection Time: 01/27/19  1:50 PM   Specimen: BLOOD  Result Value Ref Range Status   Specimen Description BLOOD LEFT ANTECUBITAL  Final   Special Requests   Final    BOTTLES DRAWN AEROBIC AND ANAEROBIC Blood Culture adequate volume   Culture   Final    NO GROWTH 4 DAYS Performed at [facility], [address], [place], [place] [address]    Report Status PENDING  Incomplete  Blood Culture (routine x 2)     Status: None (Preliminary result)   Collection Time: 01/27/19  1:50 PM   Specimen: BLOOD  Result Value Ref Range Status   Specimen Description BLOOD BLOOD RIGHT FOREARM  Final   Special Requests   Final    BOTTLES  DRAWN AEROBIC AND ANAEROBIC Blood Culture adequate volume   Culture   Final    NO GROWTH 4 DAYS Performed at [facility], [address], [place], [place] [address]    Report Status PENDING  Incomplete      Radiology Studies: No results found.   Scheduled Meds: . apixaban  5 mg Oral BID  . vitamin C  500 mg Oral Daily  . atenolol  25 mg Oral Daily  .  dexamethasone  6 mg Oral Daily  . pravastatin  20 mg Oral q1800  . zinc sulfate  220 mg Oral Daily   Continuous Infusions: . sodium chloride 100 mL/hr at 01/31/19 1504  . diltiazem (CARDIZEM) infusion    . remdesivir 200 mg in sodium chloride 0.9% 250 mL IVPB     Followed by  . remdesivir 100 mg in NS 100 mL 100 mg (01/31/19 1036)     LOS: 3 days     Enzo Bi, MD [facility] If 7PM-7AM, please contact night-coverage 01/31/2019, 5:17 PM

## 2019-01-31 NOTE — Plan of Care (Signed)
  Problem: Elimination: Goal: Will not experience complications related to urinary retention Outcome: Progressing   Problem: Safety: Goal: Ability to remain free from injury will improve Outcome: Progressing   Problem: Respiratory: Goal: Will maintain a patent airway Outcome: Progressing

## 2019-01-31 NOTE — Progress Notes (Signed)
IV cardizem 10mg  given at 0806  And 1045 for Afib RVR. no changes in HR. IV metoprolol 5mg  given at 1532, no changes in HR 126-135. Pt asymptomatic. Dr Billie Ruddy notified of no changes after cardizem and metoprolol. Poor appetite and oral intake. Pt refuse to eat and clinches his teeth shut when nurse tries to feed him or he spits it out. Orders for 0.9% ns @100 . Orders given for Cardizem gtt. Pt daughter pam updated.

## 2019-01-31 NOTE — Progress Notes (Signed)
PT Cancellation Note  Patient Details Name: Jonathon Sutton MRN: [id number] DOB: Nov 07, 1926   Cancelled Treatment:    Reason Eval/Treat Not Completed: Medical issues which prohibited therapy   Session held this pm. Telemonitor showing HR 120's -130's at rest.  Discussed with RN who agreed with therapy hold for today.  Will continue as appropriate.   Chesley Noon 01/31/2019, 3:13 PM

## 2019-02-01 LAB — PROTIME-INR
INR: 2.3 — ABNORMAL HIGH (ref 0.8–1.2)
Prothrombin Time: 24.8 seconds — ABNORMAL HIGH (ref 11.4–15.2)

## 2019-02-01 LAB — CULTURE, BLOOD (ROUTINE X 2)
Culture: NO GROWTH
Culture: NO GROWTH
Special Requests: ADEQUATE
Special Requests: ADEQUATE

## 2019-02-01 MED ORDER — AMIODARONE HCL 200 MG PO TABS
200.0000 mg | ORAL_TABLET | Freq: Two times a day (BID) | ORAL | Status: DC
  Administered 2019-02-02: 200 mg via ORAL
  Filled 2019-02-01: qty 1

## 2019-02-01 MED ORDER — AMIODARONE HCL IN DEXTROSE 360-4.14 MG/200ML-% IV SOLN
60.0000 mg/h | INTRAVENOUS | Status: AC
  Administered 2019-02-01 (×2): 60 mg/h via INTRAVENOUS
  Filled 2019-02-01 (×2): qty 200

## 2019-02-01 MED ORDER — AMIODARONE HCL IN DEXTROSE 360-4.14 MG/200ML-% IV SOLN
30.0000 mg/h | INTRAVENOUS | Status: DC
  Administered 2019-02-01 – 2019-02-02 (×2): 30 mg/h via INTRAVENOUS
  Filled 2019-02-01: qty 200

## 2019-02-01 MED ORDER — AMIODARONE HCL 200 MG PO TABS: 200.0000 mg | ORAL_TABLET | Freq: Every day | ORAL | Status: DC

## 2019-02-01 MED ORDER — DILTIAZEM HCL 25 MG/5ML IV SOLN
5.0000 mg | Freq: Once | INTRAVENOUS | Status: AC
  Administered 2019-02-01: 07:00:00 5 mg via INTRAVENOUS
  Filled 2019-02-01: qty 5

## 2019-02-01 MED ORDER — SODIUM CHLORIDE 0.9 % IV SOLN: INTRAVENOUS | Status: AC

## 2019-02-01 MED ORDER — BISACODYL 10 MG RE SUPP
10.0000 mg | Freq: Once | RECTAL | Status: AC
  Administered 2019-02-01: 18:00:00 10 mg via RECTAL
  Filled 2019-02-01: qty 1

## 2019-02-01 MED ORDER — AMIODARONE LOAD VIA INFUSION
150.0000 mg | Freq: Once | INTRAVENOUS | Status: AC
  Administered 2019-02-01: 12:00:00 150 mg via INTRAVENOUS
  Filled 2019-02-01: qty 83.34

## 2019-02-01 MED ORDER — ORAL CARE MOUTH RINSE
15.0000 mL | Freq: Two times a day (BID) | OROMUCOSAL | Status: DC
  Administered 2019-02-02 – 2019-02-07 (×9): 15 mL via OROMUCOSAL

## 2019-02-01 MED ORDER — SODIUM CHLORIDE 0.9 % IV SOLN
100.0000 mg | Freq: Once | INTRAVENOUS | Status: AC
  Administered 2019-02-02: 11:00:00 100 mg via INTRAVENOUS
  Filled 2019-02-01: qty 100

## 2019-02-01 NOTE — Consult Note (Signed)
  Amiodarone Drug - Drug Interaction Consult Note  Recommendations: No major drug interactions. Monitor K+ and Mg.   Amiodarone is metabolized by the cytochrome P450 system and therefore has the potential to cause many drug interactions. Amiodarone has an average plasma half-life of 50 days (range 20 to 100 days).   There is potential for drug interactions to occur several weeks or months after stopping treatment and the onset of drug interactions may be slow after initiating amiodarone.   []  Statins: Increased risk of myopathy. Simvastatin- restrict dose to 20mg  daily. Other statins: counsel patients to report any muscle pain or weakness immediately.  []  Anticoagulants: Amiodarone can increase anticoagulant effect. Consider warfarin dose reduction. Patients should be monitored closely and the dose of anticoagulant altered accordingly, remembering that amiodarone levels take several weeks to stabilize.  []  Antiepileptics: Amiodarone can increase plasma concentration of phenytoin, the dose should be reduced. Note that small changes in phenytoin dose can result in large changes in levels. Monitor patient and counsel on signs of toxicity.  []  Beta blockers: increased risk of bradycardia, AV block and myocardial depression. Sotalol - avoid concomitant use.  [x]   Calcium channel blockers (diltiazem and verapamil): increased risk of bradycardia, AV block and myocardial depression. Dilt infusion stopped.   []   Cyclosporine: Amiodarone increases levels of cyclosporine. Reduced dose of cyclosporine is recommended.  []  Digoxin dose should be halved when amiodarone is started.  []  Diuretics: increased risk of cardiotoxicity if hypokalemia occurs.  []  Oral hypoglycemic agents (glyburide, glipizide, glimepiride): increased risk of hypoglycemia. Patient's glucose levels should be monitored closely when initiating amiodarone therapy.   []  Drugs that prolong the QT interval:  Torsades de pointes risk may  be increased with concurrent use - avoid if possible.  Monitor QTc, also keep magnesium/potassium WNL if concurrent therapy can't be avoided. Marland Kitchen Antibiotics: e.g. fluoroquinolones, erythromycin. . Antiarrhythmics: e.g. quinidine, procainamide, disopyramide, sotalol. . Antipsychotics: e.g. phenothiazines, haloperidol.  . Lithium, tricyclic antidepressants, and methadone. Thank You,  Oswald Hillock  02/01/2019 11:39 AM

## 2019-02-01 NOTE — Progress Notes (Signed)
Received report earlier that pt intolerant of Dilt drip due to hypotension; HR 120's -140's. SBP in low 100's. Discussed with on call APP and received order for Amiodarone bolus 150 mg x 1. SBP down to the 90', and HR initially down to 100's to 115, but was transient. HR back to 130's at this time. Message sent to on APP. Will await further orders. Pt otherwise with stable VS. Will CTM.

## 2019-02-01 NOTE — Care Management Important Message (Signed)
Important Message  Patient Details  Name: Jonathon Sutton MRN: [id number] Date of Birth: Mar 31, 1926   Medicare Important Message Given:  Yes  Verbally reviewed with daughter, Donato Heinz, at [phone]. Confirmed a copy of Medicare IM was sent to home address on file so no further copies needed at this time.   Dannette Barbara 02/01/2019, 2:56 PM

## 2019-02-01 NOTE — Progress Notes (Signed)
Physical Therapy Treatment Patient Details Name: Jonathon Sutton MRN: [id number] DOB: 10-22-1926 Today's Date: 02/01/2019    History of Present Illness Jonathon Sutton is a 86 y.o. male with medical history significant of Afib on Eliquis, HTN, CAD who presents to the emergency department with his wife who indicated the pt was not eating or drinking. Per chart, pt has been generally weak and not eating/drinking for the past 1-2 weeks. He tested positive for COVID-19 on 01/22/19. Pt lives with his 76 y.o. wife who is also COVID positive.  Pt presented to the ED on 01/20/19 for similar complaints, but left due to the long wait.    PT Comments    PT and OT in room for treatment to maximize safety, functional mobility and patient participation. Session limited due to elevated HR (ranging from 120s-140s at rest, 140s-150s with exertion). SpO2 also monitored continuously, did exhibit desaturation to low 80s with long sitting in bed. PT/OT provided multimodal cueing, but patient exhibited difficulty following commands. Hard to assess whether mental status or HOH limited patient. Bed mobility performed with maxAx2, pt able to reach for bed rails with extensive cueing. With PT, pt did sit up in long sitting with minA to spit out medication, and fatigued quickly. The patient would benefit from further skilled PT to maximize mobility, participation, and independence.    Follow Up Recommendations  SNF     Equipment Recommendations  Rolling walker with 5" wheels;3in1 (PT)    Recommendations for Other Services       Precautions / Restrictions Precautions Precautions: Fall Precaution Comments: High Fall - safety mitts donned at start/end of session. watch HR and spO2 Restrictions Weight Bearing Restrictions: No    Mobility  Bed Mobility Overal bed mobility: Needs Assistance Bed Mobility: Rolling Rolling: Max assist;+2 for safety/equipment(Pt able to reach for bed rails and pull with enough  time/cueing.)         General bed mobility comments: Pt minA to come to long sitting during session, attempted again with mod-maxX2 to prep for linen repositioning. Appears more limited by cognitive status than strength/physical ability.  Transfers                 General transfer comment: deferred. Pt HR ranging from 120s-150s at rest and with exertion. spO2 dropped to 84% in long sitting, HR in high 140s.  Ambulation/Gait                 Stairs             Wheelchair Mobility    Modified Rankin (Stroke Patients Only)       Balance Overall balance assessment: Needs assistance Sitting-balance support: Feet supported;Bilateral upper extremity supported Sitting balance-Leahy Scale: Fair                                      Cognition Arousal/Alertness: Lethargic Behavior During Therapy: Agitated;Restless Overall Cognitive Status: Impaired/Different from baseline Area of Impairment: Orientation;Safety/judgement;Following commands                 Orientation Level: Disoriented to;Place;Time;Situation     Following Commands: Follows one step commands inconsistently Safety/Judgement: Decreased awareness of safety;Decreased awareness of deficits     General Comments: Pt restless/agitated with mitts donned at start/end of session. During session safety mits removed, pt noted to grab/pull at tele lines/leads. Unable to follow commands consistently, difficulty assessing whether mental status  or hearing impairment were limiter.      Exercises Other Exercises Other Exercises: Pt resistant to ROM of LE. Attempted to engage patient in functional mobility. Other Exercises: Pt environment modified to support regular sleep/wake cycles and minimize risk of delirium. Room blinds opened; TV turned on for stimulation. Encourage pt to remain awake, limited by cognitive status    General Comments General comments (skin integrity, edema, etc.): Pt  on 4L via [place] throughout session. spO2 and HR monitored continuously during session      Pertinent Vitals/Pain Pain Assessment: Faces Faces Pain Scale: No hurt Pain Location: Pt unable to state if in pain. Monitored t/o session. Pain Intervention(s): Monitored during session    Home Living                      Prior Function            PT Goals (current goals can now be found in the care plan section) Acute Rehab PT Goals Patient Stated Goal: return home Progress towards PT goals: Progressing toward goals(slowly)    Frequency    Min 2X/week      PT Plan Current plan remains appropriate    Co-evaluation   Reason for Co-Treatment: Complexity of the patient's impairments (multi-system involvement);Necessary to address cognition/behavior during functional activity;For patient/therapist safety;To address functional/ADL transfers PT goals addressed during session: Mobility/safety with mobility;Balance;Strengthening/ROM OT goals addressed during session: ADL's and self-care;Strengthening/ROM      AM-PAC PT "6 Clicks" Mobility   Outcome Measure  Help needed turning from your back to your side while in a flat bed without using bedrails?: A Lot Help needed moving from lying on your back to sitting on the side of a flat bed without using bedrails?: A Lot Help needed moving to and from a bed to a chair (including a wheelchair)?: Total Help needed standing up from a chair using your arms (e.g., wheelchair or bedside chair)?: Total Help needed to walk in hospital room?: Total Help needed climbing 3-5 steps with a railing? : Total 6 Click Score: 8    End of Session Equipment Utilized During Treatment: Oxygen Activity Tolerance: Other (comment)(limited by mental status/fatigue, HR) Patient left: in bed;with call bell/phone within reach;with bed alarm set Nurse Communication: Mobility status PT Visit Diagnosis: Muscle weakness (generalized) (M62.81);Difficulty in walking,  not elsewhere classified (R26.2);Unsteadiness on feet (R26.81)     Time: 1030-1104 PT Time Calculation (min) (ACUTE ONLY): 34 min  Charges:  $Therapeutic Activity: 8-22 mins                     Lieutenant Diego PT, DPT 2:37 PM,02/01/19

## 2019-02-01 NOTE — Progress Notes (Addendum)
PROGRESS NOTE    Jonathon Sutton  [id number] DOB: [birth year] DOA: 01/27/2019 PCP: Cletis Athens, MD    Assessment & Plan:   Principal Problem:   Poor fluid intake Active Problems:   Weakness generalized   Atrial fibrillation (HCC)   CAD (coronary artery disease)   COVID-19 virus infection   At risk for dehydration due to poor fluid intake   Confusion   Cough with exposure to COVID-19 virus   Hypothermia   Pneumonia due to COVID-19 virus    Jonathon Sutton is a 86 y.o. male with medical history significant of Afib on Eliquis, HTN, CAD who was brought to the ED by his wife for eating eating or drinking.    # Acute hypoxic respiratory failure 2/2 COVID-19 infection --dx around 01/22/19, positive for cough productive of sputum, change in taste sensation, poor PO intake, confusion.  CXR showed "Patchy bilateral interstitial and alveolar airspace disease".  Procal neg. --While working with PT/OT, "Pt tolerated sitting EOB for a few minutes. O2 sats dropped to low 80s in sitting and quickly returned to 90%+ after return to supine. Pt on 2L [place] entire session."   --5 days of Remdesivir completed on 1/12 PLAN: --continue PO decadron --continue suppl O2, wean as able --Zinc and vit C  # Poor PO intake # Confusion # Weakness --Didn't eat or drink well for at least a week prior to presentation reportedly due to change of taste.  Likely an effect from COVID infection.   --Continue to actively refusing food/drink and spitting out medication.  Has been receiving intermittent IVF for hydration. PLAN: --PT/OT rec SNF rehab --More gentle IVF hydration today since pt is not having good PO intake  # AKI, POA, improved --Cr 1.59 on presentation.  Due to dehydration from poor PO intake.  After IVF hydration, Cr improved to 1.14. PLAN: --continue gentle IVF hydration PRN until PO route is secured or goals of care changes.  # Afib on home Eliquis # Afib w RVR --variable heart rate.   Home atenolol was held for the 2 days due to HR in 40's, and on 1/11, pt went into Afib w RVR. --Received IV dilt 10 mg x2, IV metop, dilt gtt and then amiodarone IV due to BP trending down.  By afternoon of 1/12, heart rate finally converted to sinus and became controlled. PLAN: --continue home Eliquis --continue IV amio gtt for now since pt is not able to take his home atenolol (spits out medications)  # Hx of HTN --BP currently low normal PLAN: --Hold home atenolol --hold home Isosorbide mononitrate and Maxzide  # Hx of CAD --continue home statin    DVT prophylaxis: [id number] Code Status: Full code  Talked with daughter Jeannene Patella today and updated her on pt's current condition and his refusal to take in any PO.  Discussed goals of care and feeding options.  Daughter will discuss with rest of the family and get back to [place].  Palliative consulted today.  Disposition Plan: SNF.  Pt is not safe for discharge home, and will also need a secure source of feeding before he can go to SNF.   Subjective and Interval History:  Can't obtain ROS.  Pt can't hear and doesn't answer questions.  No fever, N/V/D, increased swelling.  Pt has been actively refusing PO intake, and swapped at the nursing when food is offered.    Pt remained in Afib RVR despite being on dilt gtt.  Overnight, amiodarone was started due to pt's  BP dropping.   Objective: Vitals:   02/01/19 1730 02/01/19 1746 02/01/19 1800 02/01/19 1815  BP: 115/67 118/73 115/62 131/81  Pulse: 62 62 (!) 59 64  Resp:      Temp:      TempSrc:      SpO2:      Weight:      Height:        Intake/Output Summary (Last 24 hours) at 02/01/2019 1903 Last data filed at 02/01/2019 0300 Gross per 24 hour  Intake 1243.52 ml  Output 300 ml  Net 943.52 ml   Filed Weights   01/28/19 1524 01/29/19 0509 01/31/19 0530  Weight: 68.6 kg 63.8 kg 62.4 kg    Examination:   Constitutional: NAD, alert, not oriented, doesn't answer questions, staring  into space today HEENT: conjunctivae and lids normal, EOMI, deaf CV: RRR no M,R,G. Distal pulses +2.  No cyanosis.   RESP: CTA B/L, normal respiratory effort, on RA  GI: +BS, NTND Extremities: No effusions, edema, or tenderness in BLE SKIN: warm, dry and intact Neuro: II - XII grossly intact.  Sensation intact   Data Reviewed: I have personally reviewed following labs and imaging studies  CBC: Recent Labs  Lab 01/27/19 1350 01/28/19 0551 01/29/19 0653 01/30/19 0502  WBC 3.9* 3.7* 2.9* 6.9  NEUTROABS 3.2  --   --   --   HGB 16.4 15.5 15.1 16.3  HCT 48.1 45.5 45.2 48.7  MCV 87.9 88.3 89.0 88.2  PLT 105* 113* 116* [id number]   Basic Metabolic Panel: Recent Labs  Lab 01/27/19 1432 01/28/19 0551 01/29/19 0653 01/30/19 0502  NA 138 140 142 142  K 4.1 3.1* 4.0 4.1  CL 102 104 105 107  CO2 21* 23 26 23   GLUCOSE 114* 97 139* 163*  BUN 46* 36* 39* 46*  CREATININE 1.59* 1.53* 1.34* 1.14  CALCIUM 8.2* 8.1* 8.3* 8.5*  MG  --  1.9 2.3 2.3   GFR: Estimated Creatinine Clearance: 38 mL/min (by C-G formula based on SCr of 1.14 mg/dL). Liver Function Tests: Recent Labs  Lab 01/27/19 1432 01/30/19 0502  AST 45* 47*  ALT 15 21  ALKPHOS 19* 19*  BILITOT 2.7* 1.8*  PROT 6.5 5.9*  ALBUMIN 2.8* 2.6*   No results for input(s): LIPASE, AMYLASE in the last 168 hours. No results for input(s): AMMONIA in the last 168 hours. Coagulation Profile: Recent Labs  Lab 01/27/19 1350 01/30/19 0502 02/01/19 0337  INR 2.3* 2.2* 2.3*   Cardiac Enzymes: No results for input(s): CKTOTAL, CKMB, CKMBINDEX, TROPONINI in the last 168 hours. BNP (last 3 results) No results for input(s): PROBNP in the last 8760 hours. HbA1C: No results for input(s): HGBA1C in the last 72 hours. CBG: Recent Labs  Lab 01/29/19 1630  GLUCAP 144*   Lipid Profile: No results for input(s): CHOL, HDL, LDLCALC, TRIG, CHOLHDL, LDLDIRECT in the last 72 hours. Thyroid Function Tests: No results for input(s): TSH,  T4TOTAL, FREET4, T3FREE, THYROIDAB in the last 72 hours. Anemia Panel: No results for input(s): VITAMINB12, FOLATE, FERRITIN, TIBC, IRON, RETICCTPCT in the last 72 hours. Sepsis Labs: Recent Labs  Lab 01/27/19 1342 01/27/19 1432 01/27/19 1601  PROCALCITON  --  <0.10  --   LATICACIDVEN 2.2*  --  1.9    Recent Results (from the past 240 hour(s))  Blood Culture (routine x 2)     Status: None   Collection Time: 01/27/19  1:50 PM   Specimen: BLOOD  Result Value Ref Range Status  Specimen Description BLOOD LEFT ANTECUBITAL  Final   Special Requests   Final    BOTTLES DRAWN AEROBIC AND ANAEROBIC Blood Culture adequate volume   Culture   Final    NO GROWTH 5 DAYS Performed at [facility], [address] [place], [place] [address]    Report Status 02/01/2019 FINAL  Final  Blood Culture (routine x 2)     Status: None   Collection Time: 01/27/19  1:50 PM   Specimen: BLOOD  Result Value Ref Range Status   Specimen Description BLOOD BLOOD RIGHT FOREARM  Final   Special Requests   Final    BOTTLES DRAWN AEROBIC AND ANAEROBIC Blood Culture adequate volume   Culture   Final    NO GROWTH 5 DAYS Performed at [facility], [address] [place], [place] [address]    Report Status 02/01/2019 FINAL  Final      Radiology Studies: No results found.   Scheduled Meds: . amiodarone  200 mg Oral Q12H   Followed by  . [START ON 02/09/2019] amiodarone  200 mg Oral Daily  . apixaban  5 mg Oral BID  . vitamin C  500 mg Oral Daily  . atenolol  25 mg Oral Daily  . dexamethasone  6 mg Oral Daily  . mouth rinse  15 mL Mouth Rinse BID  . pravastatin  20 mg Oral q1800  . zinc sulfate  220 mg Oral Daily   Continuous Infusions: . amiodarone 30 mg/hr (02/01/19 1756)  . remdesivir 200 mg in sodium chloride 0.9% 250 mL IVPB     Followed by  . remdesivir 100 mg in NS 100 mL Stopped (01/31/19 1116)  . [START ON 02/02/2019] remdesivir 100 mg in NS 100 mL       LOS: 4 days       Enzo Bi, MD [facility] If 7PM-7AM, please contact night-coverage 02/01/2019, 7:03 PM

## 2019-02-01 NOTE — Progress Notes (Signed)
Pt noted to have desats to 80's on 2L[place]; HR 130's-140. Increased to 4 liters with improvement in sat. On call APP notified. Will admin cardizem 5 mg IVP as ordered.

## 2019-02-01 NOTE — Progress Notes (Signed)
Occupational Therapy Treatment Patient Details Name: Jonathon Sutton MRN: [id number] DOB: 23-Feb-1926 Today's Date: 02/01/2019    History of present illness Jonathon Sutton is a 86 y.o. male with medical history significant of Afib on Eliquis, HTN, CAD who presents to the emergency department with his wife who indicated the pt was not eating or drinking. Per chart, pt has been generally weak and not eating/drinking for the past 1-2 weeks. He tested positive for COVID-19 on 01/22/19. Pt lives with his 7 y.o. wife who is also COVID positive.  Pt presented to the ED on 01/20/19 for similar complaints, but left due to the long wait.   OT comments  Mr. Jonathon Sutton was seen for OT/PT co-treatment on this date. Upon arrival to room pt semi-supine in bed with PT/RN already in room. Pt noted to have safety mitts donned. OT/PT remove mitts for improved participation with clearance from RN this date. Mitts replaced at end of session. Pt noted to be significantly confused/agitated during this treatment session. He keeps eyes closed consistently, but is able to open them with VCs. Pt has limited ability to follow single step commands, however, pt participation improves as session progresses. Pt does engaged with therapists during bed mobility for bedding change and when coming up to long sitting in bed. Pt HR/spO2 monitored t/o session. Pt noted to have elevated HR at rest and variable t/o session. Per tele-monitor, pt HR ranges from low 120's to upper 140's throughout session. When sitting upright in bed spO2 drops to 84% and HR reaches 144. Pt returned to semi-supine for safety. SpO2 rebounds to low 90's. OT attempts pt education on maintaining appropriate sleep/wake cycles while in the hospital, however pt education limited by cognitive status this date. OT opens rooms blinds and turns on pt TV for additional stimulation during waking hours. Pt is progressing toward goals and continues to benefit from skilled OT services  to maximize return to PLOF and minimize risk of future falls, injury, caregiver burden, and readmission. Will continue to follow POC. Discharge recommendation remains appropriate.    Follow Up Recommendations  SNF    Equipment Recommendations  3 in 1 bedside commode    Recommendations for Other Services      Precautions / Restrictions Precautions Precautions: Fall Precaution Comments: High Fall - safety mitts donned at start/end of session. Restrictions Weight Bearing Restrictions: No       Mobility Bed Mobility Overal bed mobility: Needs Assistance             General bed mobility comments: Pt required mod/max A +2 to come to long sitting in bed on 1x during session. Appears more limited by cognitive status than strength/physical ability.  Transfers                 General transfer comment: Transfer deferred for pt safety. Pt HR noted to range from 120's-140's with pt at rest. SpO2 dropped to 84% with pt in long sitting in bed, HR in 140's.    Balance Overall balance assessment: Needs assistance Sitting-balance support: Feet supported;Bilateral upper extremity supported Sitting balance-Leahy Scale: Fair                                     ADL either performed or assessed with clinical judgement   ADL Overall ADL's : Needs assistance/impaired  General ADL Comments: Pt continues to be limited by cognitive status, generalzed weakness, and cardiopulmonary status. Requires mod/max A +2 to come to long sitting in bed. Pt more significantly confused this date. Limited ability to follow 1-step VCs. +2 assist for functional mobility. Max A for LB ADL at bed level. Max A for bed level toileting.     Vision       Perception     Praxis      Cognition Arousal/Alertness: Lethargic Behavior During Therapy: Agitated;Restless Overall Cognitive Status: Impaired/Different from baseline Area of  Impairment: Orientation;Safety/judgement;Following commands                 Orientation Level: Disoriented to;Place;Time;Situation     Following Commands: Follows one step commands inconsistently Safety/Judgement: Decreased awareness of safety;Decreased awareness of deficits     General Comments: Pt restles/agitated with mitts donned at start/end of session. During session safety mits removed, pt noted to grab/pull at tele lines/leads. Unable to follow VCs consistently but is able to squeeze this therapists hand when prompted 1x during session. Repeats "If you'll give me a box I can get moving" during session.        Exercises Other Exercises Other Exercises: OT/PT engage pt in functional mobility attempts while monitoring vitals and response to activity. Pt assited with bed linnen change and educated on bed mobility strategies throughout. Other Exercises: Pt environment modified to support regular sleep/wake cycles and minimize risk of delirium. Room blinds opened; TV turned on for stimulation. Encourage pt to remain awake, however education limited by cognitive status.   Shoulder Instructions       General Comments      Pertinent Vitals/ Pain       Pain Assessment: Faces Faces Pain Scale: No hurt Pain Location: Pt unable to state if in pain. Monitored t/o session. Pain Intervention(s): Monitored during session  Home Living                                          Prior Functioning/Environment              Frequency  Min 1X/week        Progress Toward Goals  OT Goals(current goals can now be found in the care plan section)  Progress towards OT goals: Progressing toward goals  Acute Rehab OT Goals Patient Stated Goal: return home OT Goal Formulation: With patient/family Time For Goal Achievement: 02/11/19 Potential to Achieve Goals: Good  Plan Discharge plan remains appropriate;Frequency remains appropriate    Co-evaluation     PT/OT/SLP Co-Evaluation/Treatment: Yes Reason for Co-Treatment: Complexity of the patient's impairments (multi-system involvement);Necessary to address cognition/behavior during functional activity;For patient/therapist safety;To address functional/ADL transfers PT goals addressed during session: Mobility/safety with mobility;Balance;Strengthening/ROM OT goals addressed during session: ADL's and self-care;Strengthening/ROM      AM-PAC OT "6 Clicks" Daily Activity     Outcome Measure   Help from another person eating meals?: A Lot Help from another person taking care of personal grooming?: A Lot Help from another person toileting, which includes using toliet, bedpan, or urinal?: Total Help from another person bathing (including washing, rinsing, drying)?: Total Help from another person to put on and taking off regular upper body clothing?: A Lot Help from another person to put on and taking off regular lower body clothing?: A Lot 6 Click Score: 10    End of Session  OT Visit Diagnosis: Other abnormalities of gait and mobility (R26.89);Muscle weakness (generalized) (M62.81)   Activity Tolerance Treatment limited secondary to agitation;Treatment limited secondary to medical complications (Comment)   Patient Left in bed;with call bell/phone within reach;with bed alarm set;with restraints reapplied(With mitts replaced.)   Nurse Communication          [id number] OT Time Calculation (min): 26 min  Charges: OT General Charges $OT Visit: 1 Visit OT Treatments $Self Care/Home Management : 8-22 mins  Shara Blazing, M.S., OTR/L Ascom: [phone] 02/01/19, 12:57 PM

## 2019-02-01 NOTE — TOC Progression Note (Signed)
Transition of Care [facility]) - Progression Note    Patient Details  Name: Jonathon Sutton MRN: [id number] Date of Birth: June 25, 1926  [facility]) CM/SW Contact  Eileen Stanford, LCSW Phone Number: 02/01/2019, 1:22 PM  Clinical Narrative:  Facility has received insurance auth however, pt not medically stable for d/c. Auth is good for 5 days.     Expected Discharge Plan: [place] Barriers to Discharge: Continued Medical Work up  Expected Discharge Plan and Services Expected Discharge Plan: [place] In-house Referral: NA   Post Acute Care Choice: [place] Living arrangements for the past 2 months: Single Family Home                                       Social Determinants of Health (SDOH) Interventions    Readmission Risk Interventions No flowsheet data found.

## 2019-02-01 NOTE — Progress Notes (Signed)
Pt heart rate continues to be unstable, ranging from 124-147. Dr Billie Ruddy notified and new orders for amiodarone gtt. Bolus initiated and then set to 60mg . Pt converted to sinus rhythm at 3:27 pm. Pt is currently on maintenance does of 30mg  16.15ml/hr. Heart rate controlled at 80. bp remains stable

## 2019-02-01 NOTE — OR Nursing (Signed)
Minimal food intake, clenches teeth and swats at feeder. four bites of food,

## 2019-02-01 NOTE — Clinical Social Work Note (Signed)
Need updated PT notes for insurance.  [place], [place]

## 2019-02-02 DIAGNOSIS — R6251 Failure to thrive (child): Secondary | ICD-10-CM

## 2019-02-02 DIAGNOSIS — R627 Adult failure to thrive: Secondary | ICD-10-CM

## 2019-02-02 LAB — BASIC METABOLIC PANEL
Anion gap: 8 (ref 5–15)
BUN: 36 mg/dL — ABNORMAL HIGH (ref 8–23)
CO2: 26 mmol/L (ref 22–32)
Calcium: 8.1 mg/dL — ABNORMAL LOW (ref 8.9–10.3)
Chloride: 118 mmol/L — ABNORMAL HIGH (ref 98–111)
Creatinine, Ser: 1.06 mg/dL (ref 0.61–1.24)
GFR calc Af Amer: >60 mL/min (ref >60)
GFR calc non Af Amer: >60 mL/min (ref >60)
Glucose, Bld: 123 mg/dL — ABNORMAL HIGH (ref 70–99)
Potassium: 3.7 mmol/L (ref 3.5–5.1)
Sodium: 152 mmol/L — ABNORMAL HIGH (ref 135–145)

## 2019-02-02 LAB — MAGNESIUM: Magnesium: 2.2 mg/dL (ref 1.7–2.4)

## 2019-02-02 LAB — CBC
HCT: 45.1 % (ref 39.0–52.0)
Hemoglobin: 15 g/dL (ref 13.0–17.0)
MCH: 29.8 pg (ref 26.0–34.0)
MCHC: 33.3 g/dL (ref 30.0–36.0)
MCV: 89.5 fL (ref 80.0–100.0)
Platelets: 194 10*3/uL (ref 150–400)
RBC: 5.04 MIL/uL (ref 4.22–5.81)
RDW: 13.2 % (ref 11.5–15.5)
WBC: 8 10*3/uL (ref 4.0–10.5)
nRBC: 0 % (ref 0.0–0.2)

## 2019-02-02 MED ORDER — SODIUM CHLORIDE 0.9 % IV SOLN
100.0000 mg | Freq: Once | INTRAVENOUS | Status: AC
  Administered 2019-02-03: 100 mg via INTRAVENOUS
  Filled 2019-02-02: qty 100

## 2019-02-02 MED ORDER — DEXTROSE 5 % IV SOLN: INTRAVENOUS | Status: AC

## 2019-02-02 NOTE — Progress Notes (Signed)
Jonathon Sutton continues to have stable HR and rhythm on IV Amiodarone at 16.6 mg/hr, currently infusing into right AC, a new site. VSS and O2 sat is 97% on 3 liter [place] Previous IV site became dislodged, and began to leak and was ecchymotic due to patient's movement in bed, therefore IV catheter was removed. Pt has mitts in place but is not restrained. Bed in low position with alarm on. Pt has refused any mouthcare or po liquids or po meds. (clamps down teeth, and refuses to swallow, then spits out) Pt did not receive po Amiodarone last evening. Pt is resistant to delivery of care, and becomes combative. Moves about in bed, turns self.   Pt also receiving IVF of NS at 100 ml per hour, to infuse for 10 hours, total 1000 cc. Infusing via left upper arm. IV was redressed due to leaking and malposition, but after redressed, remains dry at this time.

## 2019-02-02 NOTE — Progress Notes (Signed)
Facetime  video call with patient daughter  Mrs lyunch as per her request at this time .

## 2019-02-02 NOTE — Progress Notes (Signed)
PALLIATIVE NOTE:  Referral received for goals of care discussion. Updates received from RN and Dr. Posey Pronto.   I attempted to call daughter, Mrs. Donnal Debar at listed phone numbers. Voicemail left. Will await a call back. I will also attempt to call at a later date/time.   Detailed note and recommendations once GOC discussion completed.   Thank you for your referral and allowing Palliative to assist in Mr. Sobieck care.   Alda Lea, AGPCNP-BC Palliative Medicine Team   NO CHARGE

## 2019-02-02 NOTE — Consult Note (Addendum)
Consultation Note Date: 02/02/2019   Patient Name: Jonathon Sutton  DOB: 21-May-1926  MRN: [id number]  Age / Sex: 86 y.o., male   PCP: Cletis Athens, MD Referring Physician: Fritzi Mandes, MD   REASON FOR CONSULTATION:Establishing goals of care  Palliative Care consult requested for goals of care discussion in this 86 y.o. male with multiple medical problems including atrial fibrillation (Eliquis), hypertension, CAD, and lower back pain. He presented to the ED from home with complaints of confusion, generalized weakness, and poor p.o. intake.  On admission daughter reported patient having productive cough with yellow/green sputum.  His PCP prescribed Levaquin x5 days.  During work-up patient was found to be Covid positive.  CR 1.59, BUN 46, troponin 67, lactic acid 2.2.  Chest x-ray showed patchy bilateral interstitial and alveolar airspace disease.  EKG showed atrial fibrillation with variable rate.  Since admission patient continues to do poorly with fusion, intermittent agitation requiring mitts for safety, tachycardia, refusing oral nutrition and medications.  Clinical Assessment and Goals of Care: I have reviewed medical records including lab results, imaging, Epic notes, and MAR, received report from the bedside RN, and assessed the patient. I spoke with family (wife Enid Derry, daughter Olin Hauser, son Lanny Hurst) via telephone to discuss diagnosis prognosis, [place], EOL wishes, disposition and options.   I introduced Palliative Medicine as specialized medical care for people living with serious illness. It focuses on providing relief from the symptoms and stress of a serious illness. The goal is to improve quality of life for both the patient and the family.  Family members verbalized understanding.  We discussed a brief life review of the patient, along with his functional and nutritional status.  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  He also was a Psychologist, sport and exercise by trade.  He and his wife have  been married for over 40 years with 2 children.  He enjoys time with family and friends.  Daughter shares patient was alert and oriented prior to admission and had a good quality of life pre-Covid.  Prior to admission patient was ambulatory.  He was able to perform most ADLs independently with some assistance at times.  Appetite was fair prior to Covid.  Since Covid patient has not had much of an appetite and has refused to eat or drink for more than a week.  We discussed His current illness and what it means in the larger context of His on-going co-morbidities. With specific discussions regarding severe dehydration, poor nutrition, atrial fibrillation with RVR, and patient's overall functional decline.  Natural disease trajectory and expectations at EOL were discussed.  Detailed discussion with daughter who shared previous experiences with her mother-in-law and her dying process.  Daughter tearful and expressing they are not her mother-in-law's wishes however she dreaded the process of watching her "starve" during her dying process. Therapeutic listening and support provided.  Olin Hauser explains she is much more emotional regarding her father situation after having experienced that previously.  She verbalizes although her mother is the decision maker she feels patient would benefit from artificial feedings to allow opportunity to show improvement.  I discussed at length with daughter patient's overall poor prognosis despite poor nutrition. She verbalized understanding expressing she will support her brother and mother in whatever decision they choose.   Patient's son Lanny Hurst, verbalized his understanding of his father's condition. He shared he is a retired Building services engineer and more familiar with the medical side of things and focuses on quality of life not  quantity. Lanny Hurst shares some of his experiences and knowledge of elderly patients with PEG tubes that have not done well and experienced further  complications or suffering due to the process. Therapeutic listening and support provided. Lanny Hurst reports he wants his father to be comfortable as he does not wish to let him go but also does not want to be selfish.   Mrs. khari spence at this time she is considering artificial nutrition after communicating with patient's 30 plus year PCP Dr. Lavera Guise. She reports based on their discussion patient may somewhat benefit from a trial period of feedings to allow family the opportunity to show signs of improvement.   I discussed at length with all family members concerns for Mr. Radwan overall poor prognosis, pros and cons of artificial tube feeding including temporary or more long-term. I educated family on the risk of infection, dislodgment, aspiration, and most likely revisiting similar goals of care discussions given he does not show much improvement despite undergoing interventions.  Family also verbalized understanding and appreciation.  I attempted to elicit values and goals of care important to the patient.    The difference between aggressive medical intervention and comfort care was considered in light of the patient's goals of care.   Wife would like to consider all options and have further discussions tomorrow.  Patient does not have a documented advanced directive. Daughter Olin Hauser reports both she and her brother Lanny Hurst are the [facility], however they wish to allow their mother to make all final decisions and they are in support of her. I discussed patient's full code status with consideration take of his current illness and co-morbidities. Wife would like patient to remain a full code until they have time to further consider all options and their wishes.   Hospice and Palliative Care services outpatient were explained and offered. Patient and family verbalized their understanding and awareness of both palliative and hospice's goals and philosophy of care.   Questions and concerns were  addressed. The family was encouraged to call with questions or concerns.  PMT will continue to support holistically.   SOCIAL HISTORY:     reports that he has never smoked. He has never used smokeless tobacco. He reports that he does not drink alcohol or use drugs.  CODE STATUS: Full code  ADVANCE DIRECTIVES: Donato Heinz and Sherryll Burger   SYMPTOM MANAGEMENT: per attending   Palliative Prophylaxis:   Aspiration, Bowel Regimen, Delirium Protocol, Eye Care, Frequent Pain Assessment, Oral Care and Turn Reposition  PSYCHO-SOCIAL/SPIRITUAL:  Support System: Family   Desire for further Chaplaincy support: NO   Additional Recommendations (Limitations, Scope, Preferences):  Full Scope Treatment   PAST MEDICAL HISTORY: Past Medical History:  Diagnosis Date  . Arthritis   . Atrial fibrillation ([place])   . Back pain   . CAD (coronary artery disease)   . Hypertension     PAST SURGICAL HISTORY: No past surgical history on file.  ALLERGIES:  has No Known Allergies.   MEDICATIONS:  Current Facility-Administered Medications  Medication Dose Route Frequency Provider Last Rate Last Admin  . acetaminophen (TYLENOL) tablet 1,000 mg  1,000 mg Oral Q8H PRN Enzo Bi, MD   1,000 mg at 01/30/19 0948  . alum & mag hydroxide-simeth (MAALOX/MYLANTA) 200-200-20 MG/5ML suspension 15 mL  15 mL Oral Q6H PRN Enzo Bi, MD      . apixaban Arne Cleveland) tablet 5 mg  5 mg Oral BID Enzo Bi, MD   5 mg at 02/02/19 1023  . ascorbic acid (  VITAMIN C) tablet 500 mg  500 mg Oral Daily Enzo Bi, MD   500 mg at 01/31/19 2326  . atenolol (TENORMIN) tablet 25 mg  25 mg Oral Daily Enzo Bi, MD   25 mg at 02/02/19 1023  . calcium carbonate (TUMS - dosed in mg elemental calcium) chewable tablet 200 mg of elemental calcium  1 tablet Oral TID PRN Enzo Bi, MD      . dexamethasone (DECADRON) tablet 6 mg  6 mg Oral Daily Oswald Hillock, RPH   6 mg at 02/02/19 1022  . dextrose 5 % solution   Intravenous Continuous  Fritzi Mandes, MD 50 mL/hr at 02/02/19 1146 New Bag at 02/02/19 1146  . docusate sodium (COLACE) capsule 100 mg  100 mg Oral BID PRN Enzo Bi, MD   100 mg at 01/29/19 1525  . guaiFENesin-dextromethorphan (ROBITUSSIN DM) 100-10 MG/5ML syrup 10 mL  10 mL Oral Q6H PRN Enzo Bi, MD      . MEDLINE mouth rinse  15 mL Mouth Rinse BID Enzo Bi, MD   15 mL at 02/02/19 1045  . ondansetron (ZOFRAN) injection 4 mg  4 mg Intravenous Q6H PRN Enzo Bi, MD      . ondansetron (ZOFRAN-ODT) disintegrating tablet 4 mg  4 mg Oral Q8H PRN Enzo Bi, MD      . polyethylene glycol (MIRALAX / GLYCOLAX) packet 17 g  17 g Oral BID PRN Enzo Bi, MD      . pravastatin (PRAVACHOL) tablet 20 mg  20 mg Oral q1800 Enzo Bi, MD   20 mg at 01/31/19 2326  . [START ON 02/03/2019] remdesivir 100 mg in sodium chloride 0.9 % 100 mL IVPB  100 mg Intravenous Once Oswald Hillock, RPH      . zinc sulfate capsule 220 mg  220 mg Oral Daily Enzo Bi, MD   220 mg at 01/31/19 2325    VITAL SIGNS: BP 131/65 (BP Location: Right Arm)   Pulse 78   Temp (!) 97.5 F (36.4 C) (Oral)   Resp 16   Ht 5\' 9"  (1.753 m)   Wt 61.9 kg   SpO2 95%   BMI 20.15 kg/m  Filed Weights   01/29/19 0509 01/31/19 0530 02/02/19 0500  Weight: 63.8 kg 62.4 kg 61.9 kg    Estimated body mass index is 20.15 kg/m as calculated from the following:   Height as of this encounter: 5\' 9"  (1.753 m).   Weight as of this encounter: 61.9 kg.  LABS: CBC:    Component Value Date/Time   WBC 8.0 02/02/2019 0412   HGB 15.0 02/02/2019 0412   HCT 45.1 02/02/2019 0412   PLT 194 02/02/2019 0412   Comprehensive Metabolic Panel:    Component Value Date/Time   NA 152 (H) 02/02/2019 0412   K 3.7 02/02/2019 0412   CO2 26 02/02/2019 0412   BUN 36 (H) 02/02/2019 0412   CREATININE 1.06 02/02/2019 0412   ALBUMIN 2.6 (L) 01/30/2019 0502     Review of Systems  Unable to perform ROS: Mental status change   Prognosis: POOR   Discharge Planning:  To Be  Determined  Recommendations:  Full code-as confirmed by family  Continue with current plan of care per medical team  Family requesting for watchful waiting while further discussing.  Family relies heavily on patient's PCP to provide support and guidance with decision-making.  Patient's daughter is leaning towards paid while patient son and wife are undecided.  PMT will continue to support  and follow.  We will plan to follow-up with family first thing tomorrow for continued discussions, updates, and support.   Palliative Performance Scale: PPS 10-20%               The above conversation was completed via telephone due to the visitor restrictions during the COVID-19 pandemic. Thorough chart review and discussion with necessary members of the care team was completed as part of assessment. All issues were discussed and addressed but no physical exam was performed.  Family (wife, daughter, and son) expressed understanding and was in agreement with this plan.   Thank you for allowing the Palliative Medicine Team to assist in the care of this patient.  Time In: 1615 Time Out: 1755 Time Total: 100 min.   Visit consisted of counseling and education dealing with the complex and emotionally intense issues of symptom management and palliative care in the setting of serious and potentially life-threatening illness.Greater than 50%  of this time was spent counseling and coordinating care related to the above assessment and plan.  Signed by:  Alda Lea, AGPCNP-BC Palliative Medicine Team  Phone: [phone] Fax: [phone] Pager: [phone] Amion: Bjorn Pippin

## 2019-02-02 NOTE — Progress Notes (Signed)
1900 Report from day RN   2030 Shift assessment and medications given. Pt refusing medications at this time. 3 L [place]. Bilateral mittens on. D5w infusing to left arm IV at 50 ml/hr. Pt incontinent. Bed alarmed. Call bell in reach. Peri care completed

## 2019-02-02 NOTE — Progress Notes (Signed)
Patient presently resting in the bed, patient refusing all po intake, spiting uout all medication and food, combative not following command , resistless in bed  at times. Will attempted facetime video call with daughter lyunch , she was not available , will tried again this   this afternoon.

## 2019-02-02 NOTE — Progress Notes (Signed)
[facility] at [place] NAME: Jamiere Boring    MR#:  [id number]  DATE OF BIRTH:  04-02-1926  SUBJECTIVE:   Patient hard on hearing at baseline. Confused. Her RN not eating. Not taking medication. Pulled pulled out IV. Currently on IV amiodarone drip for rapid a fib with RVR. Heart rate 50-60 does not appear to be in distress. REVIEW OF SYSTEMS:   Review of Systems  Unable to perform ROS: Dementia   Tolerating Diet: no Tolerating PT:   DRUG ALLERGIES:  No Known Allergies  VITALS:  Blood pressure (!) 164/79, pulse (!) 57, temperature 97.7 F (36.5 C), temperature source Oral, resp. rate 16, height 5\' 9"  (1.753 m), weight 61.9 kg, SpO2 (!) 88 %.  PHYSICAL EXAMINATION:   Physical Exam limited exam GENERAL:  86 y.o.-year-old patient lying in the bed with no acute distress. Thin cachectic EYES: Pupils equal, round, reactive to light and accommodation.  HEENT: Head atraumatic, normocephalic. Dry oral mucosa. Lips are chapped LUNGS: Normal breath sounds bilaterally, no wheezing, rales, rhonchi. No use of accessory muscles of respiration.  CARDIOVASCULAR: S1, S2 normal. No murmurs, rubs, or gallops.  ABDOMEN: Soft, nontender, nondistended. Bowel sounds present. No organomegaly or mass.  EXTREMITIES: No cyanosis, clubbing or edema b/l.   Bilateral mitts + NEUROLOGIC: moves all extremities well. Grossly nonfocal PSYCHIATRIC:  patient is alert, very hard on hearing and confused at baseline    LABORATORY PANEL:  CBC Recent Labs  Lab 02/02/19 0412  WBC 8.0  HGB 15.0  HCT 45.1  PLT 194    Chemistries  Recent Labs  Lab 01/30/19 0502 02/02/19 0412  NA 142 152*  K 4.1 3.7  CL 107 118*  CO2 23 26  GLUCOSE 163* 123*  BUN 46* 36*  CREATININE 1.14 1.06  CALCIUM 8.5* 8.1*  MG 2.3 2.2  AST 47*  --   ALT 21  --   ALKPHOS 19*  --   BILITOT 1.8*  --    Cardiac Enzymes No results for input(s): TROPONINI in the last 168  hours. RADIOLOGY:  No results found. ASSESSMENT AND PLAN:   Jatarius Harriel Vernonis a 86 y.o.malewith medical history significant ofAfib on Eliquis, HTN, CAD who was brought to the ED by his wife for eating eating or drinking.   # Acute hypoxic respiratory failure 2/2 COVID-19 infection --diagnosed around 01/22/19,positive for cough productive of sputum, change in taste sensation, poor PO intake, confusion. CXR showed "Patchy bilateral interstitial and alveolar airspace disease". Procal neg. --3/5 days of IV Remdesivir  --continue PO decadron --continue suppl O2, wean as able --Zinc and vit C  # Poor PO intake with increasing confusion, generalized weakness and failure to thrive, acute hypernatremia --Didn't eat or drink well for at least a week prior to presentation reportedly due to change of taste. Likely an effect from COVID infection.   --Continue to actively refusing food/drink and spitting out medication.   --PT/OT rec SNF rehab -- change to IV D5 water  # AKI-- now has acute hypernatremia to poor PO intake --Cr 1.59 on presentation.  Due to dehydration from poor PO intake.  After IVF hydration, Cr improved to 1.14. -IV D5 water   # Afib on home Eliquis # Afib w RVR --variable heart rate.   -patient was started on IV amiodarone drip for elevated heart rate. Currently heart rate is in the 50s  -see IV amiodarone. Resume atenolol  ---continue home Eliquis  # Hx of  HTN --BP currently low normal  # Hx of CAD --continue home statin   DVT prophylaxis: [id number] Code Status: Full code  Talked with daughter Jeannene Patella today and updated her on pt's current condition and his refusal to take in any PO (diet and meds) per nursing staff patient is not allowing any nursing general care. -Overall has a very poor prognosis. Patient's daughter updated. She doesn't want patient to starve and feels he should get a feeding tube however not sure if his brother who also is the  healthcare power of attorney and patient's wife will agree to it -I have discussed with patient's daughter that placing a feeding tube is not going to solve most of the problems. Patient overall has a very poor prognosis. -Palliative care consultation placed.   -Daughter will discuss with rest of the family and get back to [place].    Procedures: none Family communication : daughter Olin Hauser Consults : palliative Discharge Disposition :to be determined CODE STATUS: full DVT Prophylaxis : eliquis  TOTAL TIME TAKING CARE OF THIS PATIENT: *25* minutes.  >50% time spent on counselling and coordination of care  Note: This dictation was prepared with Dragon dictation along with smaller phrase technology. Any transcriptional errors that result from this process are unintentional.  Fritzi Mandes M.D on 02/02/2019 at 12:26 PM  Between 7am to 6pm - Pager - [phone]  After 6pm go to [email or web address]  [facility]   CC: Primary care physician; Cletis Athens, MDPatient ID: Shawna Orleans, male   DOB: [birth year], 86 y.o.   MRN: [id number]

## 2019-02-03 DIAGNOSIS — U071 COVID-19: Principal | ICD-10-CM

## 2019-02-03 DIAGNOSIS — I4811 Longstanding persistent atrial fibrillation: Secondary | ICD-10-CM

## 2019-02-03 DIAGNOSIS — R410 Disorientation, unspecified: Secondary | ICD-10-CM

## 2019-02-03 DIAGNOSIS — Z7189 Other specified counseling: Secondary | ICD-10-CM

## 2019-02-03 DIAGNOSIS — R531 Weakness: Secondary | ICD-10-CM

## 2019-02-03 DIAGNOSIS — Z66 Do not resuscitate: Secondary | ICD-10-CM

## 2019-02-03 DIAGNOSIS — Z515 Encounter for palliative care: Secondary | ICD-10-CM

## 2019-02-03 DIAGNOSIS — J1282 Pneumonia due to coronavirus disease 2019: Secondary | ICD-10-CM

## 2019-02-03 LAB — BILIRUBIN, TOTAL: Total Bilirubin: 2.6 mg/dL — ABNORMAL HIGH (ref 0.3–1.2)

## 2019-02-03 LAB — CBC
HCT: 48.2 % (ref 39.0–52.0)
Hemoglobin: 16.2 g/dL (ref 13.0–17.0)
MCH: 29.9 pg (ref 26.0–34.0)
MCHC: 33.6 g/dL (ref 30.0–36.0)
MCV: 88.9 fL (ref 80.0–100.0)
Platelets: 248 10*3/uL (ref 150–400)
RBC: 5.42 MIL/uL (ref 4.22–5.81)
RDW: 13.2 % (ref 11.5–15.5)
WBC: 9.3 10*3/uL (ref 4.0–10.5)
nRBC: 0 % (ref 0.0–0.2)

## 2019-02-03 LAB — BASIC METABOLIC PANEL
Anion gap: 11 (ref 5–15)
BUN: 30 mg/dL — ABNORMAL HIGH (ref 8–23)
CO2: 26 mmol/L (ref 22–32)
Calcium: 8.3 mg/dL — ABNORMAL LOW (ref 8.9–10.3)
Chloride: 117 mmol/L — ABNORMAL HIGH (ref 98–111)
Creatinine, Ser: 1.03 mg/dL (ref 0.61–1.24)
GFR calc Af Amer: >60 mL/min (ref >60)
GFR calc non Af Amer: >60 mL/min (ref >60)
Glucose, Bld: 98 mg/dL (ref 70–99)
Potassium: 3.3 mmol/L — ABNORMAL LOW (ref 3.5–5.1)
Sodium: 154 mmol/L — ABNORMAL HIGH (ref 135–145)

## 2019-02-03 LAB — PROTIME-INR
INR: 1.6 — ABNORMAL HIGH (ref 0.8–1.2)
Prothrombin Time: 19 seconds — ABNORMAL HIGH (ref 11.4–15.2)

## 2019-02-03 LAB — MAGNESIUM: Magnesium: 2.4 mg/dL (ref 1.7–2.4)

## 2019-02-03 MED ORDER — AMIODARONE HCL IN DEXTROSE 360-4.14 MG/200ML-% IV SOLN
30.0000 mg/h | INTRAVENOUS | Status: DC
  Administered 2019-02-03 – 2019-02-04 (×2): 30 mg/h via INTRAVENOUS
  Filled 2019-02-03: qty 200

## 2019-02-03 MED ORDER — DEXTROSE 5 % IV SOLN: INTRAVENOUS | Status: DC

## 2019-02-03 MED ORDER — AMIODARONE HCL IN DEXTROSE 360-4.14 MG/200ML-% IV SOLN
60.0000 mg/h | INTRAVENOUS | Status: AC
  Administered 2019-02-03: 60 mg/h via INTRAVENOUS
  Filled 2019-02-03 (×2): qty 200

## 2019-02-03 MED ORDER — POTASSIUM CHLORIDE 10 MEQ/100ML IV SOLN
10.0000 meq | INTRAVENOUS | Status: AC
  Administered 2019-02-03 (×2): 10 meq via INTRAVENOUS
  Filled 2019-02-03 (×4): qty 100

## 2019-02-03 MED ORDER — AMIODARONE LOAD VIA INFUSION
150.0000 mg | Freq: Once | INTRAVENOUS | Status: AC
  Administered 2019-02-03: 150 mg via INTRAVENOUS
  Filled 2019-02-03: qty 83.34

## 2019-02-03 MED ORDER — SODIUM CHLORIDE 0.45 % IV BOLUS
500.0000 mL | Freq: Once | INTRAVENOUS | Status: AC
  Administered 2019-02-03: 13:00:00 500 mL via INTRAVENOUS

## 2019-02-03 NOTE — Progress Notes (Signed)
Jonathon Sutton at [place] NAME: Jonathon Sutton    MR#:  [id number]  DATE OF BIRTH:  Jul 14, 1926  SUBJECTIVE:   Patient remains extremely confused intermittent agitation Per RN not eating. Not taking medication. Not able to take part in conversation. Heart rate this morning in the 140s. Appears very dehydrated REVIEW OF SYSTEMS:   Review of Systems  Unable to perform ROS: Dementia   Tolerating Diet: no Tolerating PT:   DRUG ALLERGIES:  No Known Allergies  VITALS:  Blood pressure (!) 70/56, pulse (!) 110, temperature 98.1 F (36.7 C), temperature source Oral, resp. rate 20, height 5\' 9"  (1.753 m), weight 61.9 kg, SpO2 98 %.  PHYSICAL EXAMINATION:   Physical Exam limited exam GENERAL:  86 y.o.-year-old patient lying in the bed with mild acute distress. Thin cachectic EYES: Pupils equal, round, reactive to light and accommodation.  HEENT: Head atraumatic, normocephalic. Dry oral mucosa. Lips are chapped LUNGS: Normal breath sounds bilaterally, no wheezing, rales, rhonchi. No use of accessory muscles of respiration.  CARDIOVASCULAR: S1, S2 normal. No murmurs, rubs, or gallops. Tachycardia ABDOMEN: Soft, nontender, nondistended. Bowel sounds present. No organomegaly or mass.  EXTREMITIES: No cyanosis, clubbing or edema b/l.   Bilateral mitts + NEUROLOGIC: moves all extremities well. Grossly nonfocal PSYCHIATRIC:  patient is alert, very hard on hearing and confused at baseline  clinically very dehydrated  LABORATORY PANEL:  CBC Recent Labs  Lab 02/03/19 0412  WBC 9.3  HGB 16.2  HCT 48.2  PLT 248    Chemistries  Recent Labs  Lab 01/30/19 0502 02/02/19 0412 02/03/19 0412  NA 142   < > 154*  K 4.1   < > 3.3*  CL 107   < > 117*  CO2 23   < > 26  GLUCOSE 163*   < > 98  BUN 46*   < > 30*  CREATININE 1.14   < > 1.03  CALCIUM 8.5*   < > 8.3*  MG 2.3   < > 2.4  AST 47*  --   --   ALT 21  --   --   ALKPHOS 19*  --   --    BILITOT 1.8*  --  2.6*   < > = values in this interval not displayed.   Cardiac Enzymes No results for input(s): TROPONINI in the last 168 hours. RADIOLOGY:  No results found. ASSESSMENT AND PLAN:   Jonathon Kniseley Vernonis a 86 y.o.malewith medical history significant ofAfib on Eliquis, HTN, CAD who was brought to the ED by his wife for eating eating or drinking.   # Acute hypoxic respiratory failure 2/2 COVID-19 infection --diagnosed around 01/22/19,positive for cough productive of sputum, change in taste sensation, poor PO intake, confusion. CXR showed "Patchy bilateral interstitial and alveolar airspace disease". Procal neg. --completed 5/5 days of IV Remdesivir  --continue PO decadron --continue suppl O2, wean as able --Zinc and vit C - patient has not been taking his oral meds.  # Poor PO intake with increasing confusion, generalized weakness and failure to thrive, acute hypernatremia --Didn't eat or drink well for at least a week prior to presentation reportedly due to change of taste. Likely an effect from COVID infection.   --Continues to actively refusing food/drink and spitting out medication.   --PT/OT rec SNF rehab -- change to IV D5 water -sodium 154  # AKI-- now has acute hypernatremia to poor PO intake --Cr 1.59 on presentation.  Due to dehydration  from poor PO intake.  After IVF hydration, Cr improved to 1.14. -IV D5 water -sodium up to 154   # Afib on home Eliquis # Afib w RVR --variable heart rate.   -patient was started on IV amiodarone drip for elevated heart rate. Currently heart rate is in the 50s -- amiodarone stopped---heart rate today in the 140s-- resumed IV amiodarone -patient not taking oral atenolol --- to be on Eliquis however not taking oral meds  # Hx of HTN --BP currently low   # Hx of CAD --continue home statin  patient has multitude of comorbidities and has been worsens given COVID pneumonia/infection. He is overall declining  and has a poor prognosis. He is very unstable from cardiac and respiratory standpoint. Very dehydrated. I spoke at length with patient's primary care Dr. Rebecka Sutton. Patient now is a DNR and Dr. Rebecka Sutton also agrees to hold off on feeding tube given how sick patient is and has poor prognosis. This was discussed with [facility] palliative care. Spoke with daughter Jonathon Sutton at length and explained patient's poor prognosis. Discussed with daughter about comfort care/hospice options.   Procedures: none Family communication : daughter Jonathon Sutton Consults : palliative Discharge Disposition :to be determined CODE STATUS: DNR DVT Prophylaxis : eliquis  TOTAL TIME TAKING CARE OF THIS PATIENT: *35* minutes.  >50% time spent on counselling and coordination of care  Note: This dictation was prepared with Dragon dictation along with smaller phrase technology. Any transcriptional errors that result from this process are unintentional.  Jonathon Sutton M.D on 02/03/2019 at 1:39 PM  Between 7am to 6pm - Pager - [phone]  After 6pm go to [email or web address]  [facility]   CC: Primary care physician; Jonathon Sutton, MDPatient ID: Jonathon Sutton, male   DOB: 1926-05-18, 85 y.o.   MRN: [id number]

## 2019-02-03 NOTE — Progress Notes (Signed)
Daily Progress Note   Patient Name: Jonathon Sutton       Date: 02/03/2019 DOB: 11/20/1926  Age: 86 y.o. MRN#: [id number] Attending Physician: Fritzi Mandes, MD Primary Care Physician: Cletis Athens, MD Admit Date: 01/27/2019  Reason for Consultation/Follow-up: Establishing goals of care  Subjective: Chart reviewed, updates received from RN and Dr. Posey Pronto. Patient remains in [place] isolation. Confused, intermittent agitation, poor oral nutrition, refusing medications and some care. Prognosis remains poor.   No family at the bedside due to [place] restrictions and visitation policy.   I spoke with patient's wife via phone and provided updates. Today is her birthday. She verbalized understanding of all updates. She is tearful expressing she does not wish for her husband to suffer but also is unsure given his "ok" quality of life months ago. She shared his noticeable decline since COVID also sharing her experience overcoming the virus. She reports patient has been increasingly more confused, not eating, and extremely weak. She reports not being able to functionally manage him in the home due to his significant decline since the virus.  We discussed goals of care conversation from yesterday. Updates provided as patient continues to show signs of worsening and I emphasized concerns of proceeding with artificial feeding. Wife verbalized understanding and expressed she would like for a discussion to take place with patient's PCP and gain his support prior to making a final decision. Support provided.   I used this opportunity to further discuss patient's full code status with concerns given he is showing signs of worsening and poor prognosis. I educated wife in detail of what a code situation would look like  with consideration to his current illness and co-morbidities. Mrs. Looney verbalized she did not feel patient would want to be placed on life-support or have heroic measures performed in his current state. She verbalizes wishes for DNR/DNI expressing she will call and notify her children of her wishes. Support given. I again explained what DNR/DNI status would look like for patient. Mrs. Devincent verbalized understanding and again requested to change status to DNR.   1445: I called to follow up with wife after further discussions with Dr. Posey Pronto. Wife confirms wishes for no artificial feedings at this time with awareness that discussions with PCP had taken place and he was in full agreement with DNR and  patient's poor prognosis. Wife verbalized appreciation of updates. I discussed with Mrs. Palazzolo plans to continue treatment over the next 24 hours and watchfully waiting. We discussed transitioning to comfort/hospice care in the setting patient continues to decline and/or shows no meaningful recovery. Mrs. Rachow verbalized understanding and appreciation of all the support and care.   Questions answered and support provided.    Length of Stay: 6  Current Medications: Scheduled Meds:  . amiodarone  150 mg Intravenous Once  . apixaban  5 mg Oral BID  . vitamin C  500 mg Oral Daily  . atenolol  25 mg Oral Daily  . dexamethasone  6 mg Oral Daily  . mouth rinse  15 mL Mouth Rinse BID  . pravastatin  20 mg Oral q1800  . zinc sulfate  220 mg Oral Daily    Continuous Infusions: . amiodarone     Followed by  . amiodarone    . dextrose 50 mL/hr at 02/02/19 2300  . potassium chloride    . remdesivir 100 mg in NS 100 mL      PRN Meds: acetaminophen, alum & mag hydroxide-simeth, calcium carbonate, docusate sodium, guaiFENesin-dextromethorphan, ondansetron (ZOFRAN) IV, ondansetron, polyethylene glycol    Vital Signs: BP 96/64   Pulse (!) 143   Temp 98.3 F (36.8 C)   Resp 20   Ht 5\' 9"  (1.753  m)   Wt 61.9 kg   SpO2 93%   BMI 20.15 kg/m  SpO2: SpO2: 93 % O2 Device: O2 Device: Nasal Cannula O2 Flow Rate: O2 Flow Rate (L/min): 3 L/min  Intake/output summary:   Intake/Output Summary (Last 24 hours) at 02/03/2019 1026 Last data filed at 02/02/2019 2300 Gross per 24 hour  Intake 937.9 ml  Output --  Net 937.9 ml   LBM: Last BM Date: 02/02/19 Baseline Weight: Weight: 68 kg Most recent weight: Weight: 61.9 kg       Palliative Assessment/Data: PPS 10-20%     Patient Active Problem List   Diagnosis Date Noted  . Failure to thrive (0-17)   . Pneumonia due to COVID-19 virus 01/28/2019  . Weakness generalized 01/27/2019  . COVID-19 01/27/2019  . At risk for dehydration due to poor fluid intake 01/27/2019  . Poor fluid intake 01/27/2019  . Confusion 01/27/2019  . Cough with exposure to COVID-19 virus 01/27/2019  . Hypothermia 01/27/2019  . Atrial fibrillation ([place])   . CAD (coronary artery disease)     Palliative Care Assessment & Plan   Recommendations/Plan:  DNR/DNI-as confirmed by wife and family  Continue with current plan of care per medical team with a goal of watchful waiting over the next 24hrs.   Will plan to follow up with family tomorrow for updates and further decisions regarding transitioning to comfort in the setting of no signs of improvement or further decline.   PMT will continue to support and follow.   Goals of Care and Additional Recommendations:  Limitations on Scope of Treatment: Continue to treat the treatable, watchful waiting, DNR  Code Status:    Code Status Orders  (From admission, onward)         Start     Ordered   01/28/19 0158  Full code  Continuous     01/28/19 0158        Code Status History    This patient has a current code status but no historical code status.   Advance Care Planning Activity     The above conversation was completed via  telephone due to the visitor restrictions during the COVID-19 pandemic.  Thorough chart review and discussion with necessary members of the care team was completed as part of assessment. All issues were discussed and addressed but no physical exam was performed to preserve PPE.   Prognosis:  Poor   Discharge Planning:  To Be Determined  Care plan was discussed with patient's wife, RN, and Dr. Posey Pronto.   Thank you for allowing the Palliative Medicine Team to assist in the care of this patient.  Total Time: 65 min.   Greater than 50%  of this time was spent counseling and coordinating care related to the above assessment and plan.  Alda Lea, AGPCNP-BC Palliative Medicine Team   Please contact Palliative Medicine Team phone at [phone] for questions and concerns.

## 2019-02-03 NOTE — Consult Note (Addendum)
  Amiodarone Drug - Drug Interaction Consult Note  Recommendations: No major drug interactions. Monitor K+ and Mg. Limited IV access. Potassium runs ordered. Pt may not be able to get them due to limited IV sites. Not sure if potassium infusions are compatible with the amiodarone infusion - do not recommend to infuse them together. Pt is unable to take PO meds.   Amiodarone is metabolized by the cytochrome P450 system and therefore has the potential to cause many drug interactions. Amiodarone has an average plasma half-life of 50 days (range 20 to 100 days).   There is potential for drug interactions to occur several weeks or months after stopping treatment and the onset of drug interactions may be slow after initiating amiodarone.   []  Statins: Increased risk of myopathy. Simvastatin- restrict dose to 20mg  daily. Other statins: counsel patients to report any muscle pain or weakness immediately.  []  Anticoagulants: Amiodarone can increase anticoagulant effect. Consider warfarin dose reduction. Patients should be monitored closely and the dose of anticoagulant altered accordingly, remembering that amiodarone levels take several weeks to stabilize.  []  Antiepileptics: Amiodarone can increase plasma concentration of phenytoin, the dose should be reduced. Note that small changes in phenytoin dose can result in large changes in levels. Monitor patient and counsel on signs of toxicity.  []  Beta blockers: increased risk of bradycardia, AV block and myocardial depression. Sotalol - avoid concomitant use.  []   Calcium channel blockers (diltiazem and verapamil): increased risk of bradycardia, AV block and myocardial depression. Dilt infusion stopped.   []   Cyclosporine: Amiodarone increases levels of cyclosporine. Reduced dose of cyclosporine is recommended.  []  Digoxin dose should be halved when amiodarone is started.  []  Diuretics: increased risk of cardiotoxicity if hypokalemia occurs.  []  Oral  hypoglycemic agents (glyburide, glipizide, glimepiride): increased risk of hypoglycemia. Patient's glucose levels should be monitored closely when initiating amiodarone therapy.   []  Drugs that prolong the QT interval:  Torsades de pointes risk may be increased with concurrent use - avoid if possible.  Monitor QTc, also keep magnesium/potassium WNL if concurrent therapy can't be avoided. Marland Kitchen Antibiotics: e.g. fluoroquinolones, erythromycin. . Antiarrhythmics: e.g. quinidine, procainamide, disopyramide, sotalol. . Antipsychotics: e.g. phenothiazines, haloperidol.  . Lithium, tricyclic antidepressants, and methadone. Thank You,  Oswald Hillock  02/03/2019 9:38 AM

## 2019-02-03 NOTE — Progress Notes (Signed)
OT Cancellation Note  Patient Details Name: Jonathon Sutton MRN: [id number] DOB: 09-04-1926   Cancelled Treatment:    Reason Eval/Treat Not Completed: Medical issues which prohibited therapy  Upon chart review this AM, Pt's resting HR at 143bpm and K+ at 3.21mmol/L. Will attempt to f/u for OT tx once pt has received  Amiodarone. Thank you.  Gerrianne Scale, [place], OTR/L ascom [phone] 02/03/19, 10:48 AM

## 2019-02-03 NOTE — Progress Notes (Signed)
PT Cancellation Note  Patient Details Name: Jonathon Sutton MRN: [id number] DOB: August 09, 1926   Cancelled Treatment:    Reason Eval/Treat Not Completed: (Upon chart review this AM, Pt's resting HR at 143bpm, RN informed PT patient will recieve medication this AM. Will attempt to f/u for PTtx once pt has received  medication.)   Lieutenant Diego PT, DPT 10:51 AM,02/03/19

## 2019-02-04 DIAGNOSIS — E860 Dehydration: Secondary | ICD-10-CM

## 2019-02-04 DIAGNOSIS — R6251 Failure to thrive (child): Secondary | ICD-10-CM

## 2019-02-04 DIAGNOSIS — R627 Adult failure to thrive: Secondary | ICD-10-CM

## 2019-02-04 LAB — CBC
HCT: 42.9 % (ref 39.0–52.0)
Hemoglobin: 14.5 g/dL (ref 13.0–17.0)
MCH: 29.7 pg (ref 26.0–34.0)
MCHC: 33.8 g/dL (ref 30.0–36.0)
MCV: 87.9 fL (ref 80.0–100.0)
Platelets: 210 10*3/uL (ref 150–400)
RBC: 4.88 MIL/uL (ref 4.22–5.81)
RDW: 13.4 % (ref 11.5–15.5)
WBC: 6.8 10*3/uL (ref 4.0–10.5)
nRBC: 0 % (ref 0.0–0.2)

## 2019-02-04 LAB — BASIC METABOLIC PANEL
Anion gap: 6 (ref 5–15)
BUN: 28 mg/dL — ABNORMAL HIGH (ref 8–23)
CO2: 26 mmol/L (ref 22–32)
Calcium: 7.8 mg/dL — ABNORMAL LOW (ref 8.9–10.3)
Chloride: 111 mmol/L (ref 98–111)
Creatinine, Ser: 0.97 mg/dL (ref 0.61–1.24)
GFR calc Af Amer: >60 mL/min (ref >60)
GFR calc non Af Amer: >60 mL/min (ref >60)
Glucose, Bld: 240 mg/dL — ABNORMAL HIGH (ref 70–99)
Potassium: 3.9 mmol/L (ref 3.5–5.1)
Sodium: 143 mmol/L (ref 135–145)

## 2019-02-04 LAB — MAGNESIUM: Magnesium: 2.2 mg/dL (ref 1.7–2.4)

## 2019-02-04 MED ORDER — MORPHINE SULFATE (PF) 2 MG/ML IV SOLN: 1.0000 mg | INTRAVENOUS | Status: DC | PRN

## 2019-02-04 MED ORDER — LORAZEPAM 2 MG/ML IJ SOLN: 0.5000 mg | Freq: Four times a day (QID) | INTRAMUSCULAR | Status: DC | PRN

## 2019-02-04 MED ORDER — POTASSIUM CHLORIDE 10 MEQ/100ML IV SOLN
10.0000 meq | Freq: Once | INTRAVENOUS | Status: AC
  Administered 2019-02-04: 10 meq via INTRAVENOUS
  Filled 2019-02-04: qty 100

## 2019-02-04 NOTE — Consult Note (Signed)
  Amiodarone Drug - Drug Interaction Consult Note  Recommendations: No major drug interactions. Monitor K+ and Mg.   Amiodarone is metabolized by the cytochrome P450 system and therefore has the potential to cause many drug interactions. Amiodarone has an average plasma half-life of 50 days (range 20 to 100 days).   There is potential for drug interactions to occur several weeks or months after stopping treatment and the onset of drug interactions may be slow after initiating amiodarone.   []  Statins: Increased risk of myopathy. Simvastatin- restrict dose to 20mg  daily. Other statins: counsel patients to report any muscle pain or weakness immediately. -on pravstatin  []  Anticoagulants: Amiodarone can increase anticoagulant effect. Consider warfarin dose reduction. Patients should be monitored closely and the dose of anticoagulant altered accordingly, remembering that amiodarone levels take several weeks to stabilize.  []  Antiepileptics: Amiodarone can increase plasma concentration of phenytoin, the dose should be reduced. Note that small changes in phenytoin dose can result in large changes in levels. Monitor patient and counsel on signs of toxicity.  [x]  Beta blockers: increased risk of bradycardia, AV block and myocardial depression. Sotalol - avoid concomitant use.  -on atenolol  []   Calcium channel blockers (diltiazem and verapamil): increased risk of bradycardia, AV block and myocardial depression. Dilt infusion stopped.   []   Cyclosporine: Amiodarone increases levels of cyclosporine. Reduced dose of cyclosporine is recommended.  []  Digoxin dose should be halved when amiodarone is started.  []  Diuretics: increased risk of cardiotoxicity if hypokalemia occurs.  []  Oral hypoglycemic agents (glyburide, glipizide, glimepiride): increased risk of hypoglycemia. Patient's glucose levels should be monitored closely when initiating amiodarone therapy.   [x]  Drugs that prolong the QT  interval:  Torsades de pointes risk may be increased with concurrent use - avoid if possible.  Monitor QTc, also keep magnesium/potassium WNL if concurrent therapy can't be avoided. Marland Kitchen Antibiotics: e.g. fluoroquinolones, erythromycin. . Antiarrhythmics: e.g. quinidine, procainamide, disopyramide, sotalol. . Antipsychotics: e.g. phenothiazines, haloperidol.  . Lithium, tricyclic antidepressants, and methadone.   -ondansetron  Thank You,  Ciearra Rufo A  02/04/2019 12:41 PM

## 2019-02-04 NOTE — Progress Notes (Signed)
PT Cancellation Note  Patient Details Name: Jonathon Sutton MRN: [id number] DOB: 11/30/1926   Cancelled Treatment:    Reason Eval/Treat Not Completed: Other (comment)(PT to hold treatment session until clear POC has been established for patient.)   Lieutenant Diego PT, DPT 3:07 PM,02/04/19

## 2019-02-04 NOTE — Progress Notes (Signed)
Talked to Dr. Posey Pronto because they have a family meeting about patient's plan of care. Family decided for patient to be comfort care, will wait for MD's orders.

## 2019-02-04 NOTE — Plan of Care (Signed)
Tried to feed patient however he refuses to eat or drink. Crushed pills in apple sauce patient only took one bite and refused the rest. MD Posey Pronto on the floor and notified. Update given to daughter Olin Hauser.  Problem: Clinical Measurements: Goal: Ability to maintain clinical measurements within normal limits will improve Outcome: Not Progressing Goal: Will remain free from infection Outcome: Not Progressing Goal: Cardiovascular complication will be avoided Outcome: Not Progressing
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
## 2019-02-04 NOTE — Progress Notes (Signed)
[facility] [place] NAME: Jonathon Sutton    MR#:  [id number]  DATE OF BIRTH:  [birth year]  SUBJECTIVE:   Patient remains extremely confused intermittent agitation Per RN not eating. Not taking medication. Not able to take part in conversation. Heart rate in the 90's  blood pressure fluctuating-- drops down to as low in the 70s. Appears  dehydrated. No PO intake. REVIEW OF SYSTEMS:   Review of Systems  Unable to perform ROS: Dementia   Tolerating Diet: no Tolerating PT:   DRUG ALLERGIES:  No Known Allergies  VITALS:  Blood pressure 112/82, pulse 85, temperature 98.4 F (36.9 C), temperature source Axillary, resp. rate 18, height 5\' 9"  (1.753 m), weight 61.9 kg, SpO2 92 %.  PHYSICAL EXAMINATION:   Physical Exam limited exam GENERAL:  86 y.o.-year-old patient lying in the bed with mild acute distress. Thin cachectic critically ill debilitated HEENT: Head atraumatic, normocephalic. Dry oral mucosa. Lips are chapped LUNGS: Normal breath sounds bilaterally, no wheezing, rales, rhonchi. No use of accessory muscles of respiration.  CARDIOVASCULAR: S1, S2 normal. No murmurs, rubs, or gallops. Tachycardia ABDOMEN: Soft, nontender, nondistended. Bowel sounds present. No organomegaly or mass.  EXTREMITIES: No cyanosis, clubbing or edema b/l.   Bilateral mitts + NEUROLOGIC: moves all extremities well. Grossly nonfocal PSYCHIATRIC:  patient keeps his eyes closed,  very hard on hearing and confused at baseline   LABORATORY PANEL:  CBC Recent Labs  Lab 02/04/19 0819  WBC 6.8  HGB 14.5  HCT 42.9  PLT 210    Chemistries  Recent Labs  Lab 01/30/19 0502 02/02/19 0412 02/03/19 0412 02/03/19 0412 02/04/19 0819  NA 142   < > 154*   < > 143  K 4.1   < > 3.3*   < > 3.9  CL 107   < > 117*   < > 111  CO2 23   < > 26   < > 26  GLUCOSE 163*   < > 98   < > 240*  BUN 46*   < > 30*   < > 28*  CREATININE 1.14   < > 1.03   < > 0.97  CALCIUM  8.5*   < > 8.3*   < > 7.8*  MG 2.3   < > 2.4   < > 2.2  AST 47*  --   --   --   --   ALT 21  --   --   --   --   ALKPHOS 19*  --   --   --   --   BILITOT 1.8*  --  2.6*  --   --    < > = values in this interval not displayed.   Cardiac Enzymes No results for input(s): TROPONINI in the last 168 hours. RADIOLOGY:  No results found. ASSESSMENT AND PLAN:   Jonathon Sutton a 86 y.o.malewith medical history significant ofAfib on Eliquis, HTN, CAD who was brought to the ED by his wife for eating eating or drinking.   # Acute hypoxic respiratory failure 2/2 COVID-19 infection --diagnosed around 01/22/19,positive for cough productive of sputum, change in taste sensation, poor PO intake, confusion. CXR showed "Patchy bilateral interstitial and alveolar airspace disease". Procal neg. --completed 5/5 days of IV Remdesivir  --continue PO decadron --continue suppl O2, wean as able --Zinc and vit C - patient has not been taking his oral meds.  # Poor PO intake with increasing confusion,  generalized weakness and failure to thrive, acute hypernatremia --Didn't eat or drink well for more than a week prior to presentation reportedly due to change of taste. Likely an effect from COVID infection.   --Continues to actively refusing food/drink and spitting out medication.   --PT/OT rec SNF rehab -- change to IV D5 water -sodium 142--152-- D5 water--154--143  # AKI-- now has acute hypernatremia to poor PO intake --Cr 1.59 on presentation.  Due to dehydration from poor PO intake.  After IVF hydration, Cr improved to 1.14. -IV D5 water -sodium up to 154-- down to 143. -Patient is not able to keep himself hydrated due to overall declining his condition and mental status change.  # Afib on home Eliquis, # Afib w RVR --variable heart rate.   -patient was started on IV amiodarone drip for elevated heart rate. Currently heart rate is in the 50s -- amiodarone stopped---heart rate today in the  140s-- resumed IV amiodarone -patient not taking oral atenolol --- to be on Eliquis however not taking oral meds  # Hx of HTN --BP currently low  -received boluses of IV fluids yesterday.  # Hx of CAD --continue home statin-- not taking PO meds  patient has multitude of comorbidities and has been worsens given COVID pneumonia/infection. He is overall declining and has a poor prognosis. He is very unstable from cardiac and respiratory standpoint. patient's primary care is Dr. Rebecka Apley. Patient now is a DNR and Dr. Rebecka Apley also agrees to hold off on feeding tube given how sick patient is and has poor prognosis. This was discussed with [facility] palliative care. Spoke with daughter Olin Hauser at length and explained patient's poor prognosis. Discussed with family about comfort care/hospice options.  02/04/2019-- palliative care nurse has tried to make appointment for wife to come see in person per her wishes.   Procedures: none Family communication : daughter Olin Hauser Consults : palliative Discharge Disposition :to be determined CODE STATUS: DNR DVT Prophylaxis : eliquis   TOTAL TIME TAKING CARE OF THIS PATIENT: *25* minutes.  >50% time spent on counselling and coordination of care  Note: This dictation was prepared with Dragon dictation along with smaller phrase technology. Any transcriptional errors that result from this process are unintentional.  Fritzi Mandes M.D on 02/04/2019 at 12:05 PM  Between 7am to 6pm - Pager - [phone]  After 6pm go to [email or web address]  [facility]   CC: Primary care physician; Cletis Athens, MDPatient ID: Jonathon Sutton, male   DOB: 05-Apr-1926, 86 y.o.   MRN: [id number]

## 2019-02-04 NOTE — Progress Notes (Signed)
[name], male   DOB: [birth year], 86 y.o.   MRN: [id number] I had a family meeting with patient's wife Mrs. Adella Nissen and son Lanny Hurst. We discussed patient's presentation, overall treatment, overall poor declining condition and poor prognosis. Family reports patient has lost about 50 pounds in the last several months. He was eating very poor at home also. The appreciate the care patient received here in the hospital. There in agreement with comfort care, quality of care versus quantity and open to hospice options. Patient is DNR DNI, and will place comfort care orders. Family understands patient will be getting comfort care medications only. IV fluids will be discontinued.  Time spent 45 minutes

## 2019-02-05 MED ORDER — LIDOCAINE 5 % EX PTCH
1.0000 | MEDICATED_PATCH | CUTANEOUS | Status: DC
  Administered 2019-02-05: 1 via TRANSDERMAL
  Filled 2019-02-05 (×4): qty 1

## 2019-02-05 NOTE — Progress Notes (Signed)
[facility] [place] NAME: Jonathon Sutton    MR#:  [id number]  DATE OF BIRTH:  10-Jan-1927  SUBJECTIVE:   Patient under comfort care. Resting quietly. No new issues per RN. REVIEW OF SYSTEMS:   Review of Systems  Unable to perform ROS: Dementia     DRUG ALLERGIES:  No Known Allergies  VITALS:  Blood pressure 133/86, pulse (!) 55, temperature 98.6 F (37 C), temperature source Oral, resp. rate 19, height 5\' 9"  (1.753 m), weight 61.9 kg, SpO2 94 %.  PHYSICAL EXAMINATION:   Physical Exam limited exam-- she is comfort care GENERAL:  86 y.o.-year-old patient lying in the bed with mild acute distress. Thin cachectic critically ill debilitated ed LUNGS: Normal breath sounds bilaterally, no wheezing, rales, rhonchi. No use of accessory muscles of respiration.  CARDIOVASCULAR: S1, S2 normal. No murmurs, rubs, or gallops. Tachycardia  LABORATORY PANEL:  CBC Recent Labs  Lab 02/04/19 0819  WBC 6.8  HGB 14.5  HCT 42.9  PLT 210    Chemistries  Recent Labs  Lab 01/30/19 0502 02/02/19 0412 02/03/19 0412 02/03/19 0412 02/04/19 0819  NA 142   < > 154*   < > 143  K 4.1   < > 3.3*   < > 3.9  CL 107   < > 117*   < > 111  CO2 23   < > 26   < > 26  GLUCOSE 163*   < > 98   < > 240*  BUN 46*   < > 30*   < > 28*  CREATININE 1.14   < > 1.03   < > 0.97  CALCIUM 8.5*   < > 8.3*   < > 7.8*  MG 2.3   < > 2.4   < > 2.2  AST 47*  --   --   --   --   ALT 21  --   --   --   --   ALKPHOS 19*  --   --   --   --   BILITOT 1.8*  --  2.6*  --   --    < > = values in this interval not displayed.   Cardiac Enzymes No results for input(s): TROPONINI in the last 168 hours. RADIOLOGY:  No results found. ASSESSMENT AND PLAN:   Jonathon Bragg Vernonis a 86 y.o.malewith medical history significant ofAfib on Eliquis, HTN, CAD who was brought to the ED by his wife for eating eating or drinking.   # Acute hypoxic respiratory failure 2/2 COVID-19  infection  # Poor PO intake with increasing confusion, generalized weakness and failure to thrive, acute hypernatremia  # AKI-- now has acute hypernatremia to poor PO intake  # Afib on home Eliquis, # Afib w RVR  # Hx of HTN  # Hx of CAD  Patient of care consulted for possible hospice home placement  CODE STATUS: DNR DVT Prophylaxis : comfort care  TOTAL TIME TAKING CARE OF THIS PATIENT: *15* minutes.  >50% time spent on counselling and coordination of care  Note: This dictation was prepared with Dragon dictation along with smaller phrase technology. Any transcriptional errors that result from this process are unintentional.  Fritzi Mandes M.D on 02/05/2019 at 1:44 PM [facility]   CC: Primary care physician; Jonathon Sutton, MDPatient ID: Jonathon Sutton, male   DOB: 08-06-1926, 86 y.o.   MRN: [id number]

## 2019-02-05 NOTE — Plan of Care (Signed)

## 2019-02-05 NOTE — Progress Notes (Signed)
Physical Therapy Discharge Patient Details Name: Jonathon Sutton MRN: [id number] DOB: 12-Jul-1926 Today's Date: 02/05/2019 Time:  -     Patient discharged from PT services secondary to   transition to comfort care.  Will complete PT orders at this time.  Chesley Noon 02/05/2019, 7:59 AM

## 2019-02-06 MED ORDER — LORAZEPAM 1 MG PO TABS: 1.0000 mg | ORAL_TABLET | ORAL | Status: DC | PRN

## 2019-02-06 MED ORDER — MORPHINE SULFATE (CONCENTRATE) 10 MG/0.5ML PO SOLN: 10.0000 mg | ORAL | Status: DC | PRN

## 2019-02-06 MED ORDER — LORAZEPAM 2 MG/ML PO CONC: 1.0000 mg | ORAL | Status: DC | PRN

## 2019-02-06 NOTE — Progress Notes (Signed)
[facility] at [place] NAME: Jonathon Sutton    MR#:  [id number]  DATE OF BIRTH:  [birth year]  SUBJECTIVE:   Patient under comfort care. Resting quietly. No new issues per RN.lost his IV today REVIEW OF SYSTEMS:   Review of Systems  Unable to perform ROS: Dementia   DRUG ALLERGIES:  No Known Allergies  VITALS:  Blood pressure (!) 107/54, pulse 61, temperature 98.6 F (37 C), temperature source Oral, resp. rate 16, height 5\' 9"  (1.753 m), weight 61.9 kg, SpO2 95 %.  PHYSICAL EXAMINATION:   Physical Exam limited exam-- he is comfort care GENERAL:  86 y.o.-year-old patient lying in the bed with mild acute distress. Thin cachectic critically ill debilitated ed LUNGS: Normal breath sounds bilaterally, no wheezing, rales, rhonchi. No use of accessory muscles of respiration.  CARDIOVASCULAR: S1, S2 normal. No murmurs, rubs, or gallops. Tachycardia  LABORATORY PANEL:  CBC Recent Labs  Lab 02/04/19 0819  WBC 6.8  HGB 14.5  HCT 42.9  PLT 210    Chemistries  Recent Labs  Lab 02/03/19 0412 02/03/19 0412 02/04/19 0819  NA 154*   < > 143  K 3.3*   < > 3.9  CL 117*   < > 111  CO2 26   < > 26  GLUCOSE 98   < > 240*  BUN 30*   < > 28*  CREATININE 1.03   < > 0.97  CALCIUM 8.3*   < > 7.8*  MG 2.4   < > 2.2  BILITOT 2.6*  --   --    < > = values in this interval not displayed.   Cardiac Enzymes No results for input(s): TROPONINI in the last 168 hours. RADIOLOGY:  No results found. ASSESSMENT AND PLAN:   Jonathon Sutton Jonathon Sutton a 86 y.o.malewith medical history significant ofAfib on Eliquis, HTN, CAD who was brought to the ED by his wife for eating eating or drinking.   # Acute hypoxic respiratory failure 2/2 COVID-19 infection  # Poor PO intake with increasing confusion, generalized weakness and failure to thrive, acute hypernatremia  # AKI-- now has acute hypernatremia to poor PO intake  # Afib on home Eliquis, # Afib w  RVR  # Hx of HTN  # Hx of CAD  Transition of care consulted for possible hospice home placement  CODE STATUS: DNR DVT Prophylaxis : comfort care  TOTAL TIME TAKING CARE OF THIS PATIENT: *15* minutes.  >50% time spent on counselling and coordination of care  Note: This dictation was prepared with Dragon dictation along with smaller phrase technology. Any transcriptional errors that result from this process are unintentional.  Fritzi Mandes M.D on 02/06/2019 at 9:45 AM [facility]   CC: Primary care physician; Cletis Athens, MDPatient ID: Jonathon Sutton, male   DOB: 12/28/1926, 86 y.o.   MRN: [id number]

## 2019-02-06 NOTE — TOC Progression Note (Signed)
Transition of Care [facility]) - Progression Note    Patient Details  Name: Jonathon Sutton MRN: [id number] Date of Birth: [birth year]  Transition of Care [facility]) CM/SW [place], RN Phone Number: 02/06/2019, 1:50 PM  Clinical Narrative:      Per MD request for Hospice Home consult.  I called and left a message for the hospice home intake number [phone].  Debbie returned call and said she did not have a room today, that I should fax in the referral to [phone].  Information requested was faxed.   Expected Discharge Plan: [place] Barriers to Discharge: Continued Medical Work up  Expected Discharge Plan and Services Expected Discharge Plan: [place] In-house Referral: NA   Post Acute Care Choice: [place] Living arrangements for the past 2 months: Single Family Home                                       Social Determinants of Health (SDOH) Interventions    Readmission Risk Interventions No flowsheet data found.

## 2019-02-07 NOTE — Progress Notes (Signed)
[place] attempted phone call to pt. in response to pt.'s length of stay w/o chaplain contact.  No answer.  [place] remains available as needed and may follow up w/RN staff if schedule permits.      02/07/19 0940  Clinical Encounter Type  Visited With Patient not available  Visit Type Initial

## 2019-02-07 NOTE — Progress Notes (Signed)
[facility] [place] [facility] [place] NAME: Jonathon Sutton    MR#:  [id number]  DATE OF BIRTH:  December 19, 1926  SUBJECTIVE:   Patient under comfort care. Resting quietly. No new issues per RN. REVIEW OF SYSTEMS:   Review of Systems  Unable to perform ROS: Dementia   DRUG ALLERGIES:  No Known Allergies  VITALS:  Blood pressure (!) 119/56, pulse (!) 57, temperature 98.1 F (36.7 C), temperature source Oral, resp. rate 18, height 5\' 9"  (1.753 m), weight 61.9 kg, SpO2 94 %.  PHYSICAL EXAMINATION:   Physical Exam limited exam-- he is comfort care GENERAL:  86 y.o.-year-old patient lying in the bed with mild acute distress. Thin cachectic critically ill debilitated ed LUNGS: Normal breath sounds bilaterally, no wheezing, rales, rhonchi. No use of accessory muscles of respiration.  CARDIOVASCULAR: S1, S2 normal. No murmurs, rubs, or gallops. Tachycardia  LABORATORY PANEL:  CBC Recent Labs  Lab 02/04/19 0819  WBC 6.8  HGB 14.5  HCT 42.9  PLT 210    Chemistries  Recent Labs  Lab 02/03/19 0412 02/03/19 0412 02/04/19 0819  NA 154*   < > 143  K 3.3*   < > 3.9  CL 117*   < > 111  CO2 26   < > 26  GLUCOSE 98   < > 240*  BUN 30*   < > 28*  CREATININE 1.03   < > 0.97  CALCIUM 8.3*   < > 7.8*  MG 2.4   < > 2.2  BILITOT 2.6*  --   --    < > = values in this interval not displayed.   Cardiac Enzymes No results for input(s): TROPONINI in the last 168 hours. RADIOLOGY:  No results found. ASSESSMENT AND PLAN:   Jonathon Julich Vernonis a 86 y.o.malewith medical history significant ofAfib on Eliquis, HTN, CAD who was brought to the ED by his wife for eating eating or drinking.   # Acute hypoxic respiratory failure 2/2 COVID-19 infection  # Poor PO intake with increasing confusion, generalized weakness and failure to thrive, acute hypernatremia  # AKI-- now has acute hypernatremia to poor PO intake  # Afib on home Eliquis, # Afib w RVR  # Hx  of HTN  # Hx of CAD  Transition of care consulted for hospice home placement. Wife Jonathon Sutton is aware--spoke with her this morning  CODE STATUS: DNR DVT Prophylaxis : comfort care  TOTAL TIME TAKING CARE OF THIS PATIENT: *15* minutes.  >50% time spent on counselling and coordination of care  Note: This dictation was prepared with Dragon dictation along with smaller phrase technology. Any transcriptional errors that result from this process are unintentional.  Fritzi Mandes M.D on 02/07/2019 at 11:50 AM [facility]   CC: Primary care physician; Cletis Athens, MDPatient ID: Jonathon Sutton, male   DOB: [birth year], 86 y.o.   MRN: [id number]

## 2019-02-07 NOTE — Progress Notes (Signed)
New referral for [facility] hospice home received on 1/17. Hospice is unable to offer a bed today. [place] notified. Patient information has been given to referral. Hospital Liaison to follow up tomorrow Flo Shanks BSN, RN, [place] [phone]

## 2019-02-07 NOTE — Progress Notes (Signed)
OT Cancellation Note  Patient Details Name: CHRISTINO BAUMGART MRN: [id number] DOB: 12/23/1926   Cancelled Treatment:    Reason Eval/Treat Not Completed: Other (comment). Chart reviewed. Patient discharged from OT services secondary to transition to comfort care.  Will complete OT orders at this time.  Jeni Salles, MPH, MS, OTR/L ascom [phone] 02/07/19, 8:19 AM

## 2019-02-08 DIAGNOSIS — E860 Dehydration: Secondary | ICD-10-CM

## 2019-02-08 MED ORDER — LORAZEPAM 1 MG PO TABS: 1.0000 mg | ORAL_TABLET | ORAL | 0 refills | Status: AC | PRN

## 2019-02-08 MED ORDER — MORPHINE SULFATE (CONCENTRATE) 10 MG/0.5ML PO SOLN: 10.0000 mg | ORAL | 0 refills | Status: AC | PRN

## 2019-02-08 MED ORDER — LIDOCAINE 5 % EX PTCH: 1.0000 | MEDICATED_PATCH | CUTANEOUS | 0 refills | Status: AC

## 2019-02-08 NOTE — Plan of Care (Signed)
  Problem: Clinical Measurements: Goal: Ability to maintain clinical measurements within normal limits will improve 02/08/2019 1144 by Alen Blew, RN Outcome: Adequate for Discharge 02/08/2019 1144 by Alen Blew, RN Outcome: Adequate for Discharge Goal: Will remain free from infection 02/08/2019 1144 by Alen Blew, RN Outcome: Adequate for Discharge 02/08/2019 1144 by Alen Blew, RN Outcome: Adequate for Discharge Goal: Diagnostic test results will improve 02/08/2019 1144 by Alen Blew, RN Outcome: Adequate for Discharge 02/08/2019 1144 by Alen Blew, RN Outcome: Adequate for Discharge Goal: Respiratory complications will improve 02/08/2019 1144 by Alen Blew, RN Outcome: Adequate for Discharge 02/08/2019 1144 by Alen Blew, RN Outcome: Adequate for Discharge Goal: Cardiovascular complication will be avoided 02/08/2019 1144 by Alen Blew, RN Outcome: Adequate for Discharge 02/08/2019 1144 by Alen Blew, RN Outcome: Adequate for Discharge   Problem: Respiratory: Goal: Will maintain a patent airway 02/08/2019 1144 by Alen Blew, RN Outcome: Adequate for Discharge 02/08/2019 1144 by Alen Blew, RN Outcome: Adequate for Discharge Goal: Complications related to the disease process, condition or treatment will be avoided or minimized 02/08/2019 1144 by Alen Blew, RN Outcome: Adequate for Discharge 02/08/2019 1144 by Alen Blew, RN Outcome: Adequate for Discharge   Problem: Skin Integrity: Goal: Risk for impaired skin integrity will decrease 02/08/2019 1144 by Alen Blew, RN Outcome: Adequate for Discharge 02/08/2019 1144 by Alen Blew, RN Outcome: Adequate for Discharge   Problem: Safety: Goal: Ability to remain free from injury will improve 02/08/2019 1144 by Alen Blew, RN Outcome: Adequate for Discharge 02/08/2019 1144 by Alen Blew, RN Outcome: Adequate for Discharge

## 2019-02-08 NOTE — Plan of Care (Signed)
Poor intake (fluids/solids) continue. More alert but declines food/drinks.

## 2019-02-08 NOTE — Progress Notes (Signed)
Patient transported to Hospice at this time via EMS. Flo Shanks notified of patient departure.

## 2019-02-08 NOTE — Progress Notes (Signed)
Bed is available for patient to transfer to the hospice home today.  Writer has spoken to patient's daughter Olin Hauser and his wife, both remain in agreement with transfer. Report has been called to the hospice home by Probation officer. TOC Loletha Grayer and staff RN notified. Shelton Silvas to contact EMS for transport. Address and room number given. Thank you. Flo Shanks BSN, RN, [place] [phone]

## 2019-02-08 NOTE — Discharge Summary (Signed)
[facility] [place] [place] NAME: Jonathon Sutton    MR#:  [id number]  DATE OF BIRTH:  Jun 04, 1926  DATE OF ADMISSION:  01/27/2019 ADMITTING PHYSICIAN: Enzo Bi, MD  DATE OF DISCHARGE: 02/08/2019  PRIMARY CARE PHYSICIAN: Cletis Athens, MD    ADMISSION DIAGNOSIS:  Dehydration [E86.0] Weakness generalized [R53.1] Weakness [R53.1] COVID-19 [U07.1] Pneumonia due to COVID-19 virus [U07.1, J12.82]  DISCHARGE DIAGNOSIS:  acute hypoxic respiratory failure secondary to COVID-19 infection failure to thrive, hypernatremia, acute renal failure  SECONDARY DIAGNOSIS:   Past Medical History:  Diagnosis Date  . Arthritis   . Atrial fibrillation ([place])   . Back pain   . CAD (coronary artery disease)   . Hypertension     HOSPITAL COURSE:   Soctt Obrochta Vernonis a 86 y.o.malewith medical history significant ofAfib on Eliquis, HTN, CAD who was brought to the ED by his wife for eating eating or drinking.  # Acute hypoxic respiratory failure 2/2 COVID-19 infection -completed treatment with Remdisivir and steroids  # Poor PO intake with increasing confusion, generalized weakness and failure to thrive, acute hypernatremia -pt not eating or drinking -remains lethargic -continue comfort care measures  # AKI-- with worsening acute hypernatremia to poor PO intake  # Afib on home Eliquis, # Afib w RVR  # Hx of HTN  # Hx of CAD  Patient will discharged to hospice facility today. Family has been informed. CONSULTS OBTAINED:    DRUG ALLERGIES:  No Known Allergies  DISCHARGE MEDICATIONS:   Allergies as of 02/08/2019   No Known Allergies     Medication List    STOP taking these medications   atenolol 25 MG tablet Commonly known as: TENORMIN   Eliquis 5 MG Tabs tablet Generic drug: apixaban   isosorbide mononitrate 20 MG tablet Commonly known as: ISMO   levofloxacin 500 MG tablet Commonly known as: LEVAQUIN   loratadine 10 MG  tablet Commonly known as: CLARITIN   lovastatin 20 MG tablet Commonly known as: MEVACOR   triamterene-hydrochlorothiazide 37.5-25 MG tablet Commonly known as: MAXZIDE-25     TAKE these medications   lidocaine 5 % Commonly known as: LIDODERM Place 1 patch onto the skin daily. Remove & Discard patch within 12 hours or as directed by MD   LORazepam 1 MG tablet Commonly known as: ATIVAN Place 1 tablet (1 mg total) under the tongue every 4 (four) hours as needed for anxiety.   morphine CONCENTRATE 10 MG/0.5ML Soln concentrated solution Take 0.5 mLs (10 mg total) by mouth every 2 (two) hours as needed for moderate pain, severe pain or shortness of breath.       If you experience worsening of your admission symptoms, develop shortness of breath, life threatening emergency, suicidal or homicidal thoughts you must seek medical attention immediately by calling 911 or calling your MD immediately  if symptoms less severe.  You Must read complete instructions/literature along with all the possible adverse reactions/side effects for all the Medicines you take and that have been prescribed to you. Take any new Medicines after you have completely understood and accept all the possible adverse reactions/side effects.   Please note  You were cared for by a hospitalist during your hospital stay. If you have any questions about your discharge medications or the care you received while you were in the hospital after you are discharged, you can call the unit and asked to speak with the hospitalist on call if the hospitalist that took care of  you is not available. Once you are discharged, your primary care physician will handle any further medical issues. Please note that NO REFILLS for any discharge medications will be authorized once you are discharged, as it is imperative that you return to your primary care physician (or establish a relationship with a primary care physician if you do not have one) for  your aftercare needs so that they can reassess your need for medications and monitor your lab values. Today   SUBJECTIVE   Resting comfortably  VITAL SIGNS:  Blood pressure (!) 151/78, pulse (!) 54, temperature 97.7 F (36.5 C), temperature source Oral, resp. rate 16, height 5\' 9"  (1.753 m), weight 59.3 kg, SpO2 94 %.  I/O:    Intake/Output Summary (Last 24 hours) at 02/08/2019 1029 Last data filed at 02/08/2019 0703 Gross per 24 hour  Intake --  Output 250 ml  Net -250 ml    PHYSICAL EXAMINATION:   limited exam-- he is comfort care GENERAL:  86 y.o.-year-old patient lying in the bed with mild acute distress. Thin cachectic critically ill debilitated ed LUNGS: Normal breath sounds bilaterally, no wheezing, rales, rhonchi. No use of accessory muscles of respiration.  CARDIOVASCULAR: S1, S2 normal. No murmurs, rubs, or gallops. Tachycardia DATA REVIEW:   CBC  Recent Labs  Lab 02/04/19 0819  WBC 6.8  HGB 14.5  HCT 42.9  PLT 210    Chemistries  Recent Labs  Lab 02/03/19 0412 02/03/19 0412 02/04/19 0819  NA 154*   < > 143  K 3.3*   < > 3.9  CL 117*   < > 111  CO2 26   < > 26  GLUCOSE 98   < > 240*  BUN 30*   < > 28*  CREATININE 1.03   < > 0.97  CALCIUM 8.3*   < > 7.8*  MG 2.4   < > 2.2  BILITOT 2.6*  --   --    < > = values in this interval not displayed.    Microbiology Results   No results found for this or any previous visit (from the past 240 hour(s)).  RADIOLOGY:  No results found.   CODE STATUS:     Code Status Orders  (From admission, onward)         Start     Ordered   02/03/19 1032  Do not attempt resuscitation (DNR)  Continuous    Question Answer Comment  In the event of cardiac or respiratory ARREST Do not call a "code blue"   In the event of cardiac or respiratory ARREST Do not perform Intubation, CPR, defibrillation or ACLS   In the event of cardiac or respiratory ARREST Use medication by any route, position, wound care, and other  measures to relive pain and suffering. May use oxygen, suction and manual treatment of airway obstruction as needed for comfort.      02/03/19 1031        Code Status History    Date Active Date Inactive Code Status Order ID Comments User Context   01/28/2019 0158 02/03/2019 1031 Full Code [id number]  Enzo Bi, MD ED   Advance Care Planning Activity       TOTAL TIME TAKING CARE OF THIS PATIENT: *35* minutes.    Fritzi Mandes M.D  [facility]    CC: Primary care physician; Cletis Athens, MD

## 2019-02-08 NOTE — TOC Transition Note (Signed)
Transition of Care [facility]) - CM/SW Discharge Note   Patient Details  Name: SAMAEL PINES MRN: [id number] Date of Birth: Feb 13, 1926  [facility]) CM/SW Contact:  Eileen Stanford, LCSW Phone Number: 02/08/2019, 11:29 AM   Clinical Narrative:   Clinical Social Worker facilitated patient discharge including contacting patient family and facility to confirm patient discharge plans.  Clinical information faxed to facility and family agreeable with plan.  CSW arranged ambulance transport via [facility] to [place].  [facility] states pt is 4th in line behind any emergency calls.   Final next level of care: [place] Barriers to Discharge: No Barriers Identified   Patient Goals and CMS Choice Patient states their goals for this hospitalization and ongoing recovery are:: to get better CMS Medicare.gov Compare Post Acute Care list provided to:: Patient Represenative (must comment) Choice offered to / list presented to : Spouse  Discharge Placement              Patient chooses bed at: ([place]) Patient to be transferred to facility by: [facility]   Patient and family notified of of transfer: 02/08/19  Discharge Plan and Services In-house Referral: NA   Post Acute Care Choice: [place]                               Social Determinants of Health (SDOH) Interventions     Readmission Risk Interventions No flowsheet data found.  [place], [place]

## 2019-02-10 ENCOUNTER — Other Ambulatory Visit: Payer: Self-pay

## 2019-02-10 NOTE — Patient Outreach (Signed)
[facility]) Care Management  02/10/2019  CY BARRISH [birth year] [id number]     Transition of Care Referral  Referral Date: 02/10/2019 Referral Source: [facility] Discharge Report Date of Admission: Diagnosis: COVID-29 Date of Discharge: 02/08/2019 Facility: [place]: [facility] Medicare    Referral received. Upon chart review noted that patient discharged from hospital to [place]. [facility] does not follow hospice patients.    Plan: RN CM will close case at this time.    Enzo Montgomery, RN,BSN,CCM [facility] Telephonic Care Management Coordinator Direct Phone: [phone] Toll Free: [phone] Fax: [phone]

## 2019-03-21 DEATH — deceased
